# Patient Record
Sex: Female | Born: 1990
Health system: Southern US, Community
[De-identification: ages and names within clinical notes are randomized; demographics above are authoritative.]

## PROBLEM LIST (undated history)

## (undated) ENCOUNTER — Inpatient Hospital Stay (HOSPITAL_COMMUNITY): Payer: Self-pay

## (undated) DIAGNOSIS — B9689 Other specified bacterial agents as the cause of diseases classified elsewhere: Secondary | ICD-10-CM

## (undated) DIAGNOSIS — Z8619 Personal history of other infectious and parasitic diseases: Secondary | ICD-10-CM

## (undated) DIAGNOSIS — D649 Anemia, unspecified: Secondary | ICD-10-CM

## (undated) DIAGNOSIS — N760 Acute vaginitis: Secondary | ICD-10-CM

## (undated) HISTORY — DX: Anemia, unspecified: D64.9

## (undated) HISTORY — DX: Other specified bacterial agents as the cause of diseases classified elsewhere: N76.0

## (undated) HISTORY — DX: Other specified bacterial agents as the cause of diseases classified elsewhere: B96.89

## (undated) HISTORY — DX: Personal history of other infectious and parasitic diseases: Z86.19

---
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
## 2011-04-14 ENCOUNTER — Inpatient Hospital Stay (INDEPENDENT_AMBULATORY_CARE_PROVIDER_SITE_OTHER)
Admission: RE | Admit: 2011-04-14 | Discharge: 2011-04-14 | Disposition: A | Discharge status: Home / Self Care | Payer: Medicaid Other | Source: Ambulatory Visit | Attending: Family Medicine | Admitting: Family Medicine

## 2011-04-14 DIAGNOSIS — N390 Urinary tract infection, site not specified: Secondary | ICD-10-CM

## 2011-04-14 LAB — POCT URINALYSIS DIP (DEVICE)
Bilirubin Urine: NEGATIVE
Glucose, UA: NEGATIVE mg/dL
Nitrite: POSITIVE — AB
Urobilinogen, UA: 0.2 mg/dL (ref 0.0–1.0)

## 2011-04-14 LAB — POCT PREGNANCY, URINE: Preg Test, Ur: NEGATIVE

## 2011-05-14 IMAGING — CR DG RIBS W/ CHEST 3+V*L*
3 series · 3 of 3 positions shown · non-contrast
Comparison: 02/17/2006.

CLINICAL DATA: Fall.  Anterior rib pain.  Vomiting blood.

LEFT RIBS AND CHEST - 3+ VIEW

[w chest pa]
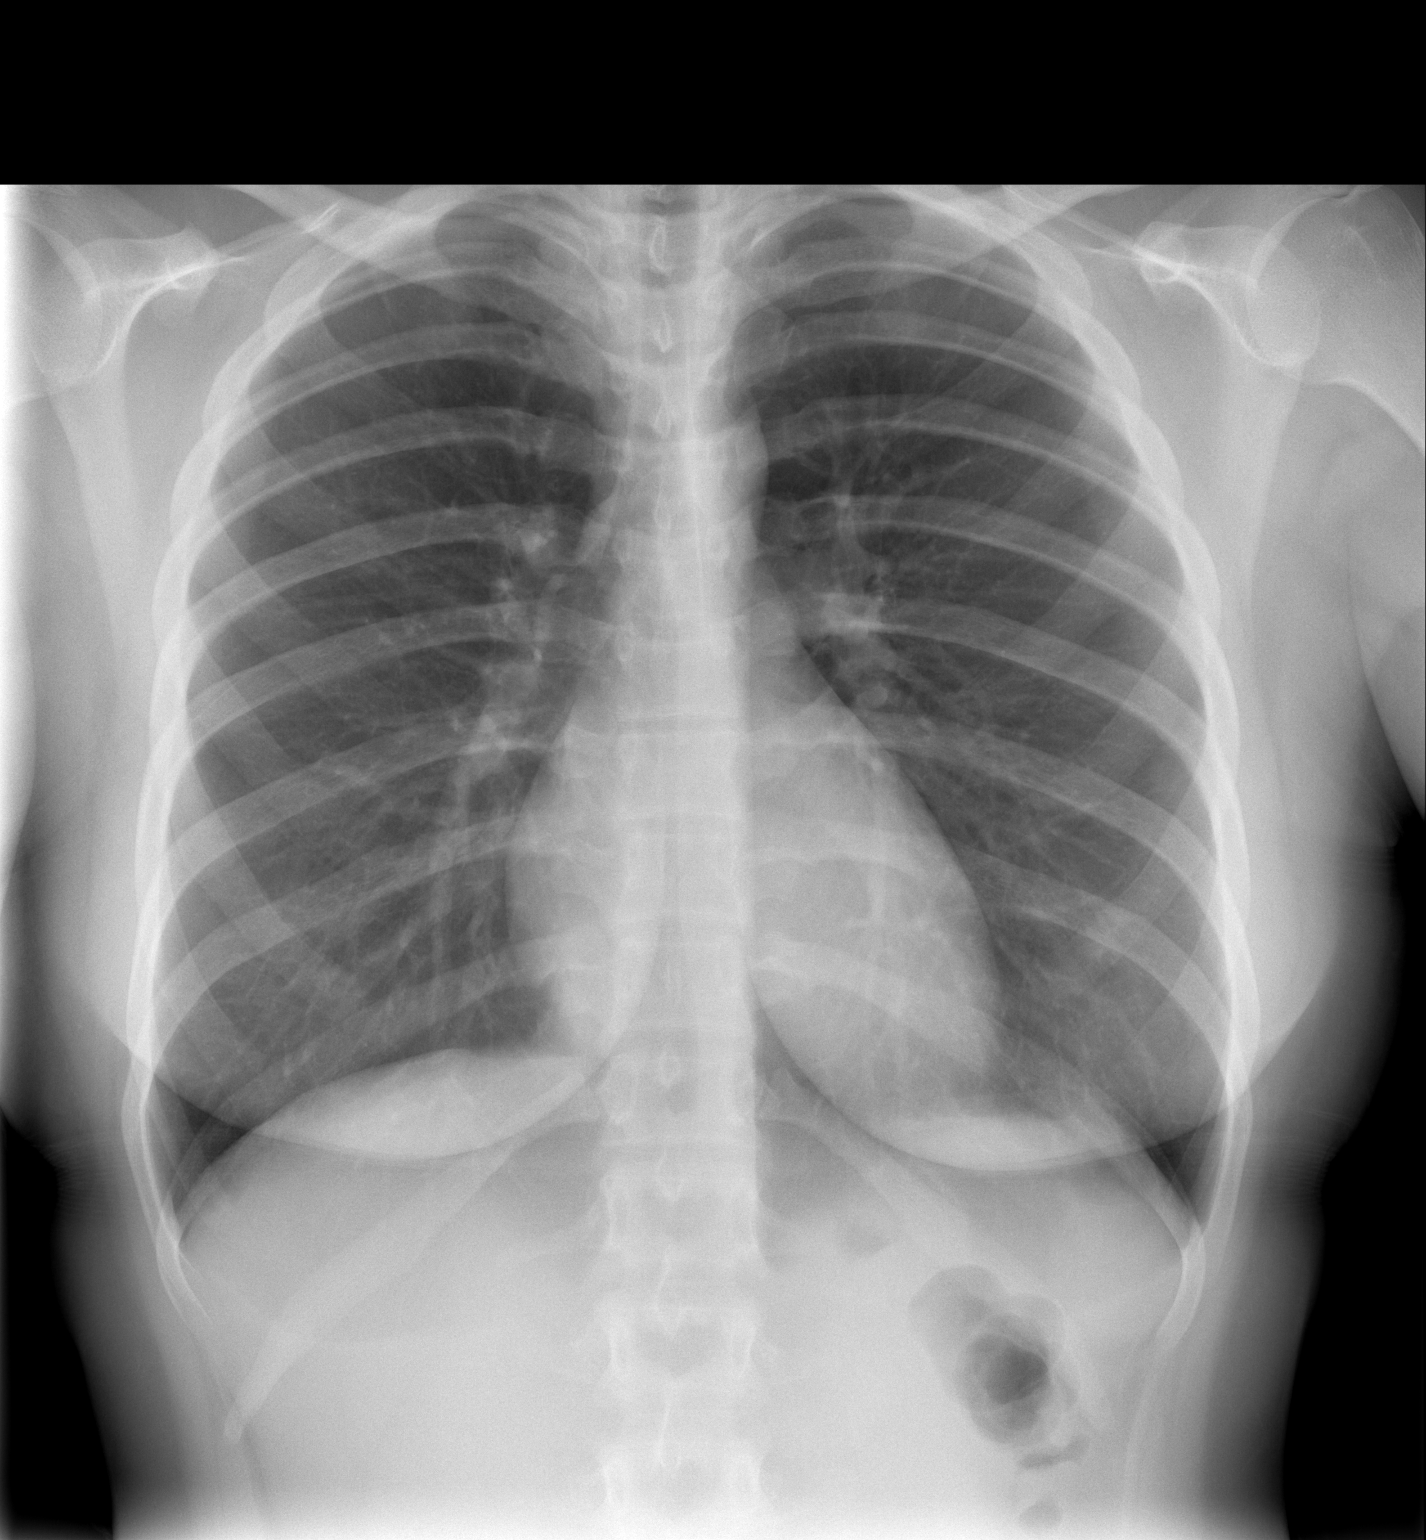

[w ribs ap/pa upper left]
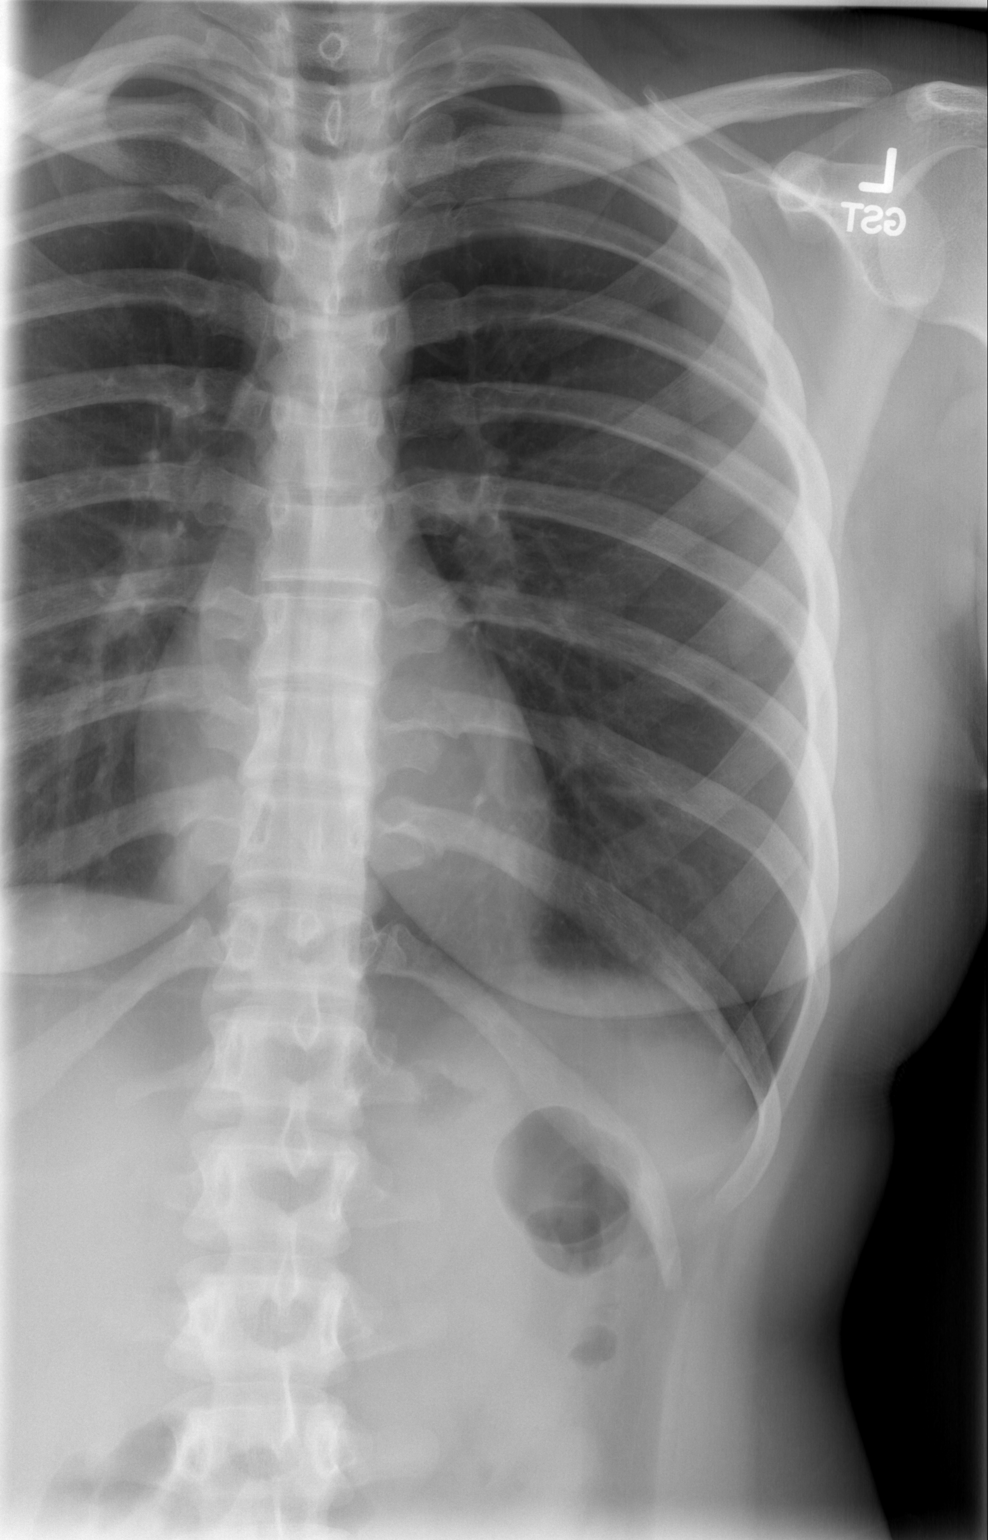

[w ribs oblique left]
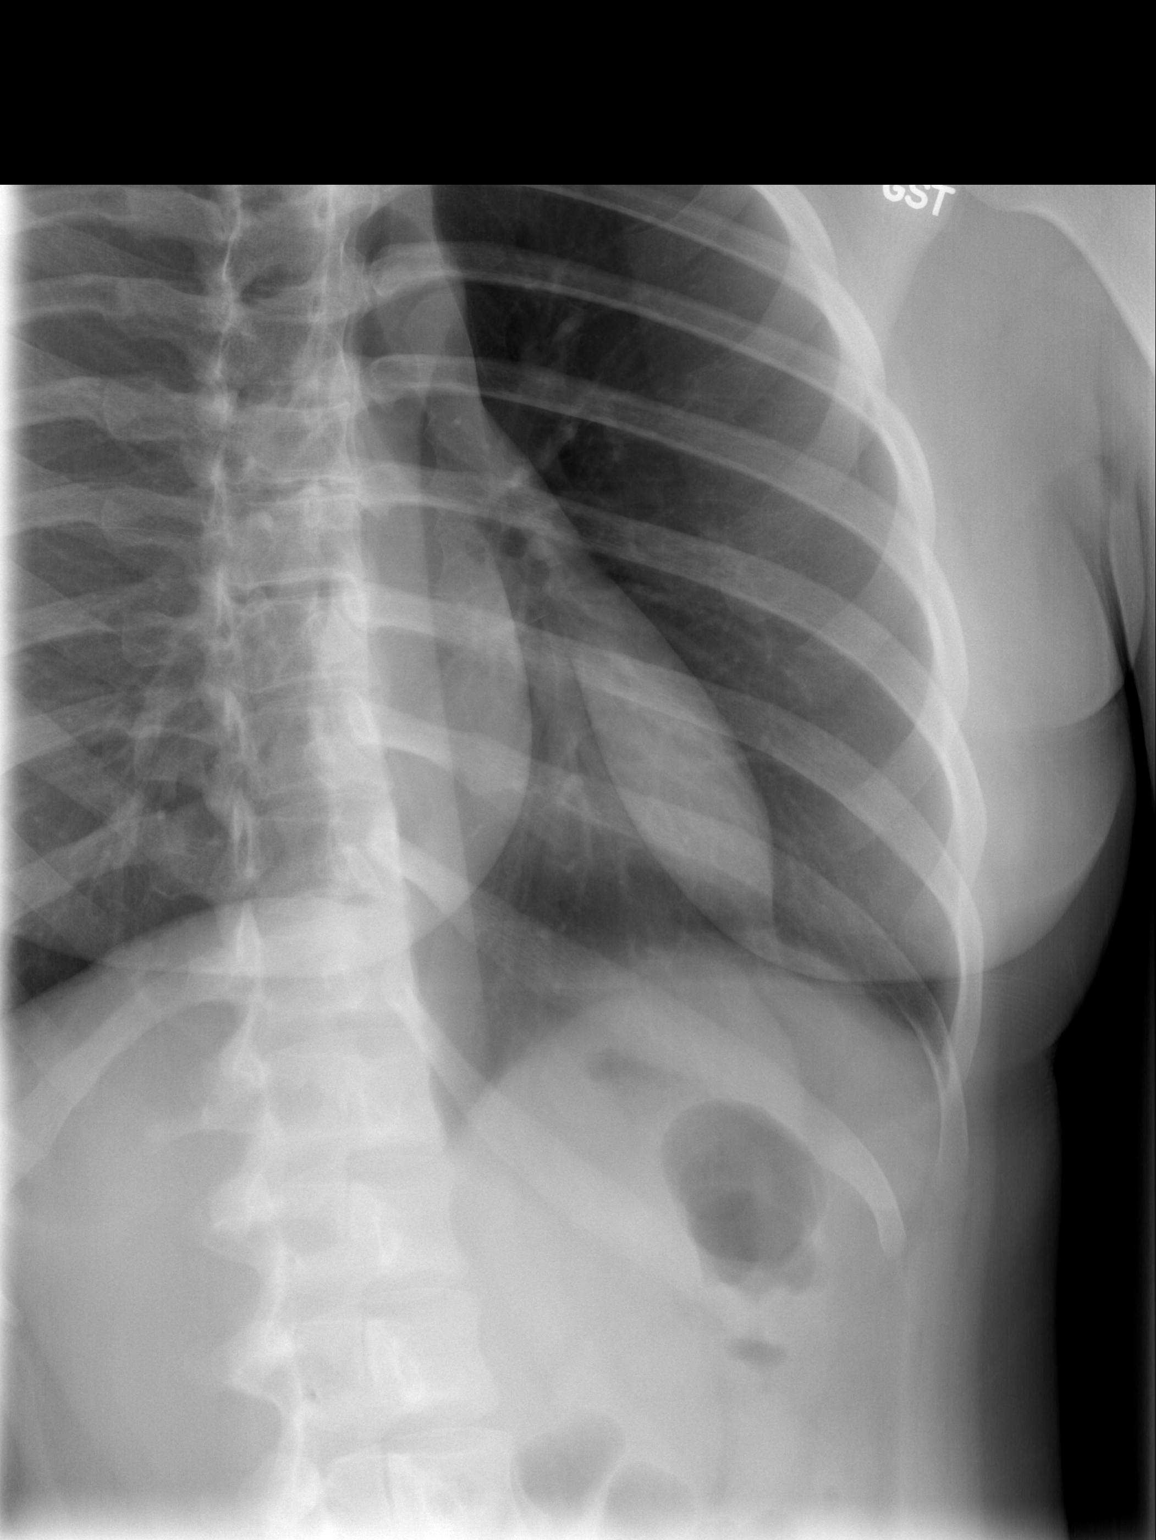

[3 of 3 positions shown; findings below may reference images not displayed]

FINDINGS: No displaced rib fractures or pneumothorax.
Cardiopericardial silhouette appears within normal limits.
Tracheal air column unremarkable.  Mediastinal contours normal.  No
pleural fluid.
IMPRESSION: No active cardiopulmonary disease or displaced rib fracture.

## 2011-09-16 ENCOUNTER — Inpatient Hospital Stay (HOSPITAL_COMMUNITY)
Admission: AD | Admit: 2011-09-16 | Discharge: 2011-09-17 | Disposition: A | Discharge status: Home / Self Care | Payer: Medicaid Other | Source: Ambulatory Visit | Attending: Obstetrics and Gynecology | Admitting: Obstetrics and Gynecology

## 2011-09-16 ENCOUNTER — Encounter (HOSPITAL_COMMUNITY): Payer: Self-pay | Admitting: *Deleted

## 2011-09-16 DIAGNOSIS — O99891 Other specified diseases and conditions complicating pregnancy: Secondary | ICD-10-CM | POA: Insufficient documentation

## 2011-09-16 DIAGNOSIS — R109 Unspecified abdominal pain: Secondary | ICD-10-CM | POA: Insufficient documentation

## 2011-09-16 LAB — URINALYSIS, ROUTINE W REFLEX MICROSCOPIC
Glucose, UA: NEGATIVE mg/dL
Ketones, ur: NEGATIVE mg/dL
Protein, ur: NEGATIVE mg/dL
Urobilinogen, UA: 0.2 mg/dL (ref 0.0–1.0)

## 2011-09-16 LAB — URINE MICROSCOPIC-ADD ON

## 2011-09-16 MED ORDER — PRENATAL VITAMINS 0.8 MG PO TABS: 1.0000 | ORAL_TABLET | Freq: Every day | ORAL | Status: DC

## 2011-09-16 NOTE — Progress Notes (Signed)
Written and verbal d/c instructions given and understanding voiced. 

## 2011-09-16 NOTE — Progress Notes (Signed)
Stacy Cruz CNM in to see pt 

## 2011-09-16 NOTE — Progress Notes (Signed)
Pt states,. " I started having pain in my right low abdomen yesterday and this evening it started again and it goes into my back."

## 2011-09-16 NOTE — ED Provider Notes (Signed)
Chief Complaint:  Abdominal Pain   Stacy Cruz is  20 y.o. G1P0 at [redacted]w[redacted]d presents complaining of Abdominal Pain .  She states none contractions associated with none vaginal bleeding, intact membranes, along with active fetal movement. She had a pain in upper abdomen that lasted around 10 minutes and was relieved by walking.  She ate a lot of beans earlier today  Obstetrical/Gynecological History: Menstrual History: OB History    Grav Para Term Preterm Abortions TAB SAB Ect Mult Living   1              Patient's last menstrual period was 04/05/2011.     Past Medical History: History reviewed. No pertinent past medical history.  Past Surgical History: Past Surgical History  Procedure Date  . Dental surgery     Family History: Family History  Problem Relation Age of Onset  . Anesthesia problems Neg Hx   . Hypotension Neg Hx   . Malignant hyperthermia Neg Hx   . Pseudochol deficiency Neg Hx     Social History: History  Substance Use Topics  . Smoking status: Never Smoker   . Smokeless tobacco: Not on file  . Alcohol Use: No    Allergies: Allergies not on file  Meds:  No prescriptions prior to admission    Review of Systems - Please refer to the aforementioned patients' reports.     Physical Exam  Height 5\' 4"  (1.626 m), weight 67.189 kg (148 lb 2 oz), last menstrual period 04/05/2011. GENERAL: Well-developed, well-nourished female in no acute distress.  LUNGS: Clear to auscultation bilaterally.  HEART: Regular rate and rhythm. ABDOMEN: Soft, nontender, nondistended, gravid.  EXTREMITIES: Nontender, no edema, 2+ distal pulses. Cervical Exam: Dilatation 0cm   Effacement 0%   Station not in pelvis   Presentation: unknown FHT:  Baseline rate 140 bpm   Variability moderate  Accelerations Reassuring   Decelerations none Contractions: Every 0 mins   Labs: No results found for this or any previous visit (from the past 24 hour(s)). Imaging Studies:  No  results found.  Assessment: Stacy Cruz is  20 y.o. G1P0 at [redacted]w[redacted]d presents with most likely a gas pain.  Plan: Don't eat so many beans.  Walk and massage stomach to help Pt already has an appt to start [facility] CRESENZO-DISHMAN,Stacy Cruz 12/15/201211:34 PM

## 2011-09-16 NOTE — Progress Notes (Signed)
Baby active 

## 2011-09-17 NOTE — ED Provider Notes (Signed)
Agree with above note.  Stacy Cruz 09/17/2011 7:38 AM

## 2011-10-03 NOTE — L&D Delivery Note (Signed)
Delivery Note At 12:07 PM a viable female was delivered via Vaginal, Spontaneous Delivery (Presentation: Left Occiput Anterior easy delivery of the head, loose nuchal cord x 1 slipped, easy delivery of the shoulders, baby placed on pt abd cord doubly clamped and cut by FOB. APGAR: 8, 8; weight 7 lb 2 oz (3232 g). SROM x 27 hours meconium. Placenta status:Spontaneous Intact, Spontaneous.  Cord: 3 vessels with the following complications: None.  Anesthesia: Epidural  Episiotomy: None Lacerations: Vaginal Suture Repair: 3-0 monocryl Est. Blood Loss (mL): 200  Mom to postpartum.  Baby to rooming in.  Melecio Cueto 01/22/2012, 12:57 PM   Delivery Summary for Stacy Cruz  Labor Events:   Preterm labor:   Rupture date:   Rupture time:   Rupture type: Spontaneous  Fluid Color:   Induction:   Augmentation:   Complications:   Cervical ripening:          Delivery:   Episiotomy:   Lacerations:   Repair suture:   Repair # of packets:   Blood loss (ml):    Information for the patient's newborn:  Sparkle, Aube [[id number]]    Delivery 01/22/2012 12:07 PM by  Vaginal, Spontaneous Delivery Sex:  female Gestational Age: <None> Delivery Clinician:  Lavera Guise Living?: Yes        APGARS  One minute Five minutes Ten minutes  Skin color: 0   0      Heart rate: 2   2      Grimace: 2   2      Muscle tone: 2   2      Breathing: 2   2      Totals: 8  8      Presentation/position: Vertex  Left Occiput Anterior Resuscitation: None  Cord information: 3 vessels   Disposition of cord blood: No    Blood gases sent? No Complications: None  Placenta: Delivered: 01/22/2012 12:20 PM  Spontaneous  Intact appearance Newborn Measurements: Weight: 7 lb 2 oz (3232 g)  Height: 20.25"  Head circumference: 31.1 cm  Chest circumference: 31.1 cm  Other providers: Delivery Nurse Delivery Assist Bethena Roys  Additional  information: Forceps:   Vacuum:    Breech:   Observed anomalies caput

## 2011-10-10 LAB — HIV ANTIBODY (ROUTINE TESTING W REFLEX): HIV: NONREACTIVE

## 2011-10-10 LAB — ABO/RH: RH Type: POSITIVE

## 2011-10-10 LAB — HEPATITIS B SURFACE ANTIGEN: Hepatitis B Surface Ag: NEGATIVE

## 2011-10-10 LAB — ANTIBODY SCREEN: Antibody Screen: NEGATIVE

## 2011-11-28 ENCOUNTER — Encounter (HOSPITAL_COMMUNITY): Payer: Self-pay | Admitting: *Deleted

## 2011-11-28 ENCOUNTER — Inpatient Hospital Stay (HOSPITAL_COMMUNITY)
Admission: AD | Admit: 2011-11-28 | Discharge: 2011-11-28 | Disposition: A | Discharge status: Home / Self Care | Payer: Medicaid Other | Source: Ambulatory Visit | Attending: Obstetrics and Gynecology | Admitting: Obstetrics and Gynecology

## 2011-11-28 DIAGNOSIS — N949 Unspecified condition associated with female genital organs and menstrual cycle: Secondary | ICD-10-CM | POA: Insufficient documentation

## 2011-11-28 DIAGNOSIS — Z331 Pregnant state, incidental: Secondary | ICD-10-CM

## 2011-11-28 DIAGNOSIS — O99891 Other specified diseases and conditions complicating pregnancy: Secondary | ICD-10-CM | POA: Insufficient documentation

## 2011-11-28 LAB — URINALYSIS, ROUTINE W REFLEX MICROSCOPIC
Bilirubin Urine: NEGATIVE
Glucose, UA: NEGATIVE mg/dL
Leukocytes, UA: NEGATIVE
Nitrite: NEGATIVE
Specific Gravity, Urine: <1.005 — ABNORMAL LOW (ref 1.005–1.030)
pH: 7 (ref 5.0–8.0)

## 2011-11-28 LAB — WET PREP, GENITAL: Yeast Wet Prep HPF POC: NONE SEEN

## 2011-11-28 LAB — URINE MICROSCOPIC-ADD ON

## 2011-11-28 MED ORDER — CYCLOBENZAPRINE HCL 10 MG PO TABS
10.0000 mg | ORAL_TABLET | Freq: Once | ORAL | Status: AC
  Administered 2011-11-28: 10 mg via ORAL
  Filled 2011-11-28: qty 1

## 2011-11-28 MED ORDER — ACETAMINOPHEN 500 MG PO TABS
1000.0000 mg | ORAL_TABLET | Freq: Once | ORAL | Status: AC
  Administered 2011-11-28: 1000 mg via ORAL
  Filled 2011-11-28: qty 2

## 2011-11-28 NOTE — Progress Notes (Signed)
Pt states she has been feeling pelvic pressure all day

## 2011-11-28 NOTE — ED Provider Notes (Signed)
History     Chief Complaint  Patient presents with  . Pelvic Pain   HPI Comments: Pt is a G1P0 at [redacted]w[redacted]d with cc of pelvic pain that is constant, denies ctx, VB or LOF, GFM. States pain has been going on all day.  Pregnancy significant for:  LTC Hx BV   Pelvic Pain The patient's primary symptoms include pelvic pain.      Past Medical History  Diagnosis Date  . No pertinent past medical history     Past Surgical History  Procedure Date  . Dental surgery     Family History  Problem Relation Age of Onset  . Anesthesia problems Neg Hx   . Hypotension Neg Hx   . Malignant hyperthermia Neg Hx   . Pseudochol deficiency Neg Hx     History  Substance Use Topics  . Smoking status: Never Smoker   . Smokeless tobacco: Not on file  . Alcohol Use: No    Allergies: No Known Allergies  Prescriptions prior to admission  Medication Sig Dispense Refill  . Prenatal Vit-Fe Fumarate-FA (PRENATAL MULTIVITAMIN) TABS Take 1 tablet by mouth daily.        Review of Systems  Genitourinary: Positive for pelvic pain.  All other systems reviewed and are negative.   Physical Exam   Blood pressure 117/84, pulse 102, temperature 98.2 F (36.8 C), temperature source Oral, resp. rate 20, height 5\' 4"  (1.626 m), weight 78.926 kg (174 lb), last menstrual period 04/05/2011, SpO2 100.00%.  Physical Exam  Nursing note and vitals reviewed. Constitutional: She is oriented to person, place, and time. She appears well-developed and well-nourished.  HENT:  Head: Normocephalic.  Neck: Normal range of motion.  Cardiovascular: Normal rate.   Respiratory: Effort normal.  GI: Soft. She exhibits no distension. There is no tenderness.       Gravid, soft  Genitourinary: Vaginal discharge found.       Mod amt milky white d/c  VE=cl/th/high  Musculoskeletal: Normal range of motion.  Neurological: She is alert and oriented to person, place, and time.  Skin: Skin is warm and dry.  Psychiatric: She  has a normal mood and affect. Her behavior is normal.   FHR 120 reactive CAT 1 toco quiet some UI  MAU Course  Procedures    Assessment and Plan  IUP at [redacted]w[redacted]d Wet prep, GC/CT, FFN, UA pending Will give tylenol PO Await results  Gizell Danser M 11/28/2011, 9:46 PM   2/26 at 2117 Tylenol didn't help Flexeril given PO,  Now sleepy Ready for D/C Labs WNL rv'd FKC and PTL sx's and stretches for RLP  Pt has f/u next week

## 2011-11-28 NOTE — Progress Notes (Signed)
SSE per CNM.  Wet prep and cultures collected.  VE done.  

## 2011-11-28 NOTE — Progress Notes (Signed)
[name], CNM at bedside.  Assessment done and poc discussed with pt.  

## 2011-11-28 NOTE — Discharge Instructions (Signed)

## 2011-11-29 LAB — GC/CHLAMYDIA PROBE AMP, GENITAL
Chlamydia, DNA Probe: NEGATIVE
GC Probe Amp, Genital: NEGATIVE

## 2011-12-11 ENCOUNTER — Encounter (INDEPENDENT_AMBULATORY_CARE_PROVIDER_SITE_OTHER): Payer: Medicaid Other | Admitting: Obstetrics and Gynecology

## 2011-12-11 DIAGNOSIS — Z331 Pregnant state, incidental: Secondary | ICD-10-CM

## 2011-12-19 ENCOUNTER — Encounter (INDEPENDENT_AMBULATORY_CARE_PROVIDER_SITE_OTHER): Payer: Medicaid Other | Admitting: Obstetrics and Gynecology

## 2011-12-19 DIAGNOSIS — Z3480 Encounter for supervision of other normal pregnancy, unspecified trimester: Secondary | ICD-10-CM

## 2011-12-19 DIAGNOSIS — Z202 Contact with and (suspected) exposure to infections with a predominantly sexual mode of transmission: Secondary | ICD-10-CM

## 2011-12-26 ENCOUNTER — Encounter: Payer: Medicaid Other | Admitting: Obstetrics and Gynecology

## 2011-12-27 ENCOUNTER — Encounter (INDEPENDENT_AMBULATORY_CARE_PROVIDER_SITE_OTHER): Payer: Medicaid Other | Admitting: Obstetrics and Gynecology

## 2011-12-27 DIAGNOSIS — Z3480 Encounter for supervision of other normal pregnancy, unspecified trimester: Secondary | ICD-10-CM

## 2012-01-02 ENCOUNTER — Telehealth: Payer: Self-pay

## 2012-01-02 NOTE — Telephone Encounter (Signed)
Pt calling to report abdominal "tightenings" over the last hour, and states "unsure if contractions."  Primagravida at term.  Reports EDC 01/15/12.  No LOF or VB.  Cx at her exam last week was "closed, but thinning."  GFM.  Pt hasn't tried to time contractions.  Rec'd pt time them over the next hr and if less than 5 min apart to call back and will eval in MAU, or can come in prn.  Reports "tightenings don't last that long."  F/u prn.

## 2012-01-03 ENCOUNTER — Encounter (HOSPITAL_COMMUNITY): Payer: Self-pay | Admitting: *Deleted

## 2012-01-03 ENCOUNTER — Inpatient Hospital Stay (HOSPITAL_COMMUNITY)
Admission: AD | Admit: 2012-01-03 | Discharge: 2012-01-03 | Disposition: A | Discharge status: Home / Self Care | Payer: Medicaid Other | Attending: Obstetrics and Gynecology | Admitting: Obstetrics and Gynecology

## 2012-01-03 DIAGNOSIS — O479 False labor, unspecified: Secondary | ICD-10-CM | POA: Insufficient documentation

## 2012-01-03 DIAGNOSIS — O471 False labor at or after 37 completed weeks of gestation: Secondary | ICD-10-CM

## 2012-01-03 MED ORDER — ZOLPIDEM TARTRATE 10 MG PO TABS: 10.0000 mg | ORAL_TABLET | Freq: Every evening | ORAL | Status: DC | PRN

## 2012-01-03 MED ORDER — OXYCODONE-ACETAMINOPHEN 5-325 MG PO TABS
2.0000 | ORAL_TABLET | Freq: Once | ORAL | Status: DC
  Filled 2012-01-03: qty 2

## 2012-01-03 NOTE — Discharge Instructions (Signed)

## 2012-01-03 NOTE — MAU Provider Note (Signed)
History   Stacy Cruz is a 21 y.o. SBF at 38.2 weeks who presents for labor check.  Pt called around 2330 reporting probable contractions, but had not timed them.  Rec'd pt time them and call back when less than 5 minutes apart, and rec'd if less than than after timed them for the next hour, to take a tablet of Benadryl.  Pt's mother called back around 0315 to report ctxs every 6 minutes and that she hadn't been able to sleep, and I rec'd them come in for labor check.  Pt denies any VB or LOF.  Reports GFM.  No UTI or PIH s/s.  No GI or resp c/o's.   Cx at her last appt was "closed."   Pregnancy r/f: 1.  Late to care 2.  Anemia 3.  BV in pregnancy  CSN: [id number]  Arrival date and time: 01/03/12 0344   None     Chief Complaint  Patient presents with  . Contractions   HPI  OB History    Grav Para Term Preterm Abortions TAB SAB Ect Mult Living   1               Past Medical History  Diagnosis Date  . No pertinent past medical history     Past Surgical History  Procedure Date  . Dental surgery     Family History  Problem Relation Age of Onset  . Anesthesia problems Neg Hx   . Hypotension Neg Hx   . Malignant hyperthermia Neg Hx   . Pseudochol deficiency Neg Hx   . Cancer Mother   . Diabetes Mother   . Hypertension Mother   . Cancer Father   . Diabetes Father   . Hypertension Father     History  Substance Use Topics  . Smoking status: Never Smoker   . Smokeless tobacco: Never Used  . Alcohol Use: No    Allergies: No Known Allergies  Prescriptions prior to admission  Medication Sig Dispense Refill  . diphenhydrAMINE (BENADRYL) 25 MG tablet Take 25 mg by mouth every 6 (six) hours as needed. As needed for sleep      . Prenatal Vit-Fe Fumarate-FA (PRENATAL MULTIVITAMIN) TABS Take 1 tablet by mouth daily.        ROS--see history above Physical Exam   Blood pressure 127/71, pulse 92, temperature 98.2 F (36.8 C), temperature source Oral, resp. rate 20,  height 5\' 4"  (1.626 m), weight 81.647 kg (180 lb), last menstrual period 04/05/2011.  Physical Exam  Constitutional: She is oriented to person, place, and time. She appears well-developed and well-nourished. No distress.       Pt lying on her Lt side and no grimace and no labored breathing.  HENT:  Head: Atraumatic.       glasses  Cardiovascular: Normal rate.   Respiratory: Effort normal.  GI: Soft.       gravid  Genitourinary:       Cx:  Closed/long/-2  Musculoskeletal: She exhibits no edema.  Neurological: She is alert and oriented to person, place, and time.  Skin: Skin is warm and dry.  EFM:  120, reactive, no decels, moderate variability TOCO:  ctxs q 10-15 minutes w/ UI in between  MAU Course  Procedures 1.  NST 2.  Percocet 2 tabs po in MAU before d/c home  Assessment and Plan  1.  IUP at 38.2 weeks 2.  No cervical change since last check 3.  occ'l ctx w/ UI 4.  Reactive  NST  1.  D/c'd home w/ labor precautions and FKC 2.  Pt given Rx for Ambien 10mg  po qhs prn insomnia (#30 w/ no RF) 3.  F/u this Friday 01/05/12 at office as scheduled or prn  Alicea Wente H 01/03/2012, 4:50 AM

## 2012-01-03 NOTE — MAU Note (Signed)
Pt presents for a labor check-states,"I think I'm having contractions."

## 2012-01-05 ENCOUNTER — Encounter (INDEPENDENT_AMBULATORY_CARE_PROVIDER_SITE_OTHER): Payer: Medicaid Other | Admitting: Registered Nurse

## 2012-01-05 ENCOUNTER — Encounter: Payer: Medicaid Other | Admitting: Obstetrics and Gynecology

## 2012-01-05 DIAGNOSIS — Z331 Pregnant state, incidental: Secondary | ICD-10-CM

## 2012-01-11 ENCOUNTER — Other Ambulatory Visit: Payer: Self-pay | Admitting: Obstetrics and Gynecology

## 2012-01-11 ENCOUNTER — Inpatient Hospital Stay (HOSPITAL_COMMUNITY)
Admission: AD | Admit: 2012-01-11 | Discharge: 2012-01-11 | Disposition: A | Discharge status: Home / Self Care | Payer: Medicaid Other | Source: Ambulatory Visit | Attending: Obstetrics and Gynecology | Admitting: Obstetrics and Gynecology

## 2012-01-11 ENCOUNTER — Encounter (HOSPITAL_COMMUNITY): Payer: Self-pay | Admitting: *Deleted

## 2012-01-11 ENCOUNTER — Telehealth: Payer: Self-pay | Admitting: Obstetrics and Gynecology

## 2012-01-11 DIAGNOSIS — O2390 Unspecified genitourinary tract infection in pregnancy, unspecified trimester: Secondary | ICD-10-CM | POA: Insufficient documentation

## 2012-01-11 DIAGNOSIS — B3731 Acute candidiasis of vulva and vagina: Secondary | ICD-10-CM

## 2012-01-11 DIAGNOSIS — B373 Candidiasis of vulva and vagina: Secondary | ICD-10-CM

## 2012-01-11 DIAGNOSIS — Z331 Pregnant state, incidental: Secondary | ICD-10-CM

## 2012-01-11 DIAGNOSIS — O99891 Other specified diseases and conditions complicating pregnancy: Secondary | ICD-10-CM | POA: Insufficient documentation

## 2012-01-11 LAB — AMNISURE RUPTURE OF MEMBRANE (ROM) NOT AT ARMC: Amnisure ROM: NEGATIVE

## 2012-01-11 MED ORDER — FLUCONAZOLE 200 MG PO TABS: 200.0000 mg | ORAL_TABLET | Freq: Once | ORAL | Status: AC | PRN

## 2012-01-11 NOTE — Discharge Instructions (Signed)
Labor Induction  Most women go into labor on their own between 37 and 42 weeks of the pregnancy. When this does not happen or when there is a medical need, medicine or other methods may be used to induce labor. Labor induction causes a pregnant woman's uterus to contract. It also causes the cervix to soften (ripen), open (dilate), and thin out (efface). Usually, labor is not induced before 39 weeks of the pregnancy unless there is a problem with the baby or mother. Whether your labor will be induced depends on a number of factors, including the following:  The medical condition of you and the baby.   How many weeks along you are.   The status of baby's lung maturity.   The condition of the cervix.   The position of the baby.  REASONS FOR LABOR INDUCTION  The health of the baby or mother is at risk.   The pregnancy is overdue by 1 week or more.   The water breaks but labor does not start on its own.   The mother has a health condition or serious illness such as high blood pressure, infection, placental abruption, or diabetes.   The amniotic fluid amounts are low around the baby.   The baby is distressed.  REASONS TO NOT INDUCE LABOR Labor induction may not be a good idea if:  It is shown that your baby does not tolerate labor.   An induction is just more convenient.   You want the baby to be born on a certain date, like a holiday.   You have had previous surgeries on your uterus, such as a myomectomy or the removal of fibroids.   Your placenta lies very low in the uterus and blocks the opening of the cervix (placenta previa).   Your baby is not in a head down position.   The umbilical cord drops down into the birth canal in front of the baby. This could cut off the baby's blood and oxygen supply.   You have had a previous cesarean delivery.   There areunusual circumstances, such as the baby being extremely premature.  RISKS AND COMPLICATIONS Problems may occur in the  process of induction and plans may need to be modified as a situation unfolds. Some of the risks of induction include:  Change in fetal heart rate, such as too high, too low, or erratic.   Risk of fetal distress.   Risk of infection to mother and baby.   Increased chance of having a cesarean delivery.   The rare, but increased chance that the placenta will separate from the uterus (abruption).   Uterine rupture (very rare).  When induction is needed for medical reasons, the benefits of induction may outweigh the risks. BEFORE THE PROCEDURE Your caregiver will check your cervix and the baby's position. This will help your caregiver decide if you are far enough along for an induction to work. PROCEDURE Several methods of labor induction may be used, such as:   Taking prostaglandin medicine to dilate and ripen the cervix. The medicine will also start contractions. It can be taken by mouth or by inserting a suppository into the vagina.   A thin tube (catheter) with a balloon on the end may be inserted into your vagina to dilate the cervix. Once inserted, the balloon expands with water, which causes the cervix to open.   Striping the membranes. Your caregiver inserts a finger between the cervix and membranes, which causes the cervix to be stretched and   may cause the uterus to contract. This is often done during an office visit. You will be sent home to wait for the contractions to begin. You will then come in for an induction.   Breaking the water. Your caregiver will make a hole in the amniotic sac using a small instrument. Once the amniotic sac breaks, contractions should begin. This may still take hours to see an effect.   Taking medicine to trigger or strengthen contractions. This medicine is given intravenously through a tube in your arm.  All of the methods of induction, besides stripping the membranes, will be done in the hospital. Induction is done in the hospital so that you and the  baby can be carefully monitored. AFTER THE PROCEDURE Some inductions can take up to 2 or 3 days. Depending on the cervix, it usually takes less time. It takes longer when you are induced early in the pregnancy or if this is your first pregnancy. If a mother is still pregnant and the induction has been going on for 2 to 3 days, either the mother will be sent home or a cesarean delivery will be needed. Document Released: 02/07/2007 Document Revised: 09/07/2011 Document Reviewed: 07/24/2011 [facility] Patient Information 2012 [place], [place].Vaginitis Vaginitis in a soreness, swelling and redness (inflammation) of the vagina and vulva. This is not a sexually transmitted infection.  CAUSES  Yeast vaginitis is caused by yeast (candida) that is normally found in your vagina. With a yeast infection, the candida has over grown in number to a point that upsets the chemical balance. SYMPTOMS   White thick vaginal discharge.   Swelling, itching, redness and irritation of the vagina and possibly the lips of the vagina (vulva).   Burning or painful urination.   Painful intercourse.  HOME CARE INSTRUCTIONS   Finish all medication as prescribed.   Do not have sex until treatment is completed or instructed by your healthcare giver.   Take warm sitz baths.   Do not douche.   Do not use tampons, especially scented ones.   Wear cotton underwear.   Avoid tight pants and panty hose.   Tell your sexual partner that you have a yeast infection. They should go to their caregiver if they have symptoms such as mild rash or itching.   Your sexual partner should be treated if your infection is difficult to eliminate.   Practice safer sex. Use condoms.   Some vaginal medications cause latex condoms to fail. Ask your caregiver this.  SEEK MEDICAL CARE IF:   You develop a fever.   The infection is getting worse after 2 days of treatment.   The infection is not getting better after 3 days of treatment.     You develop blisters in or around your vagina.   You develop vaginal bleeding, and it is not your menstrual period.   You have pain when you urinate.   You develop intestinal problems.   You have pain with sexual intercourse.  Document Released: 10/26/2004 Document Revised: 09/07/2011 Document Reviewed: 06/03/2009 [facility] Patient Information 2012 [place], [place].

## 2012-01-11 NOTE — MAU Note (Signed)
History    21 yo G1P0 at 2 3/7 weeks presented c/o questionable leaking since 12:30pm today.  Awoke from a nap, felt wet, and has felt wet at intervals since.  Reports occasional back pain, unsure if UCs.  Positive FM.  Cervix has been closed at the office, last visit 1 week ago.  Pregnancy remarkable for: Late to care at 30 weeks Anemia BV in pregnancy    OB History    Grav Para Term Preterm Abortions TAB SAB Ect Mult Living   1 0 0 0 0 0 0 0 0 0       Past Medical History  Diagnosis Date  . No pertinent past medical history     Past Surgical History  Procedure Date  . Dental surgery     Family History  Problem Relation Age of Onset  . Anesthesia problems Neg Hx   . Hypotension Neg Hx   . Malignant hyperthermia Neg Hx   . Pseudochol deficiency Neg Hx   . Cancer Mother   . Diabetes Mother   . Hypertension Mother   . Cancer Father   . Diabetes Father   . Hypertension Father     History  Substance Use Topics  . Smoking status: Never Smoker   . Smokeless tobacco: Never Used  . Alcohol Use: No    Allergies: No Known Allergies  Prescriptions prior to admission  Medication Sig Dispense Refill  . Prenatal Vit-Fe Fumarate-FA (PRENATAL MULTIVITAMIN) TABS Take 1 tablet by mouth every morning.          Physical Exam   Blood pressure 126/74, pulse 91, temperature 99.1 F (37.3 C), temperature source Oral, resp. rate 18, height 5\' 4"  (1.626 m), weight 182 lb (82.555 kg), last menstrual period 04/05/2011.  Chest clear Heart RRR without murmur Abd gravid, NT Pelvic--vagina full of d/c c/w yeast.  No obvious pooling. Fern negative.  Wet prep + yeast hyphae Cervix posterior, FT, 50%, vtx -2.  Amnisure done  FHR Category 1 UCs irregular, mild  ED Course  IUP at 39 3/7 weeks ? SROM  Definite yeast vaginitis  Plan: Await amnisure. If negative, anticipate d/c home with treatment for yeast.  Nigel Bridgeman, CNM, [place] 01/11/12 7:20pm

## 2012-01-11 NOTE — Progress Notes (Signed)
Fhts 120 accels LTV mod varaible x 1 uc irregular with irritability Neg amnisure A vag yeast     Not in labor P discharge home s/s labor,uc, srom, vag bleeding, kick counts to report reviewed, RX diflucan discussed repeat in 3 days. Lavera Guise, CNM

## 2012-01-11 NOTE — Telephone Encounter (Signed)
Spoke with pt.   States underwear was wet when went to the BR.  Continues to leak clear watery fluid. +FM. No cont.  Consult w/VL.   No appt. Available in office.   Pt directed to MAU   VM left for VL.

## 2012-01-12 ENCOUNTER — Encounter: Payer: Self-pay | Admitting: Obstetrics and Gynecology

## 2012-01-12 ENCOUNTER — Ambulatory Visit (INDEPENDENT_AMBULATORY_CARE_PROVIDER_SITE_OTHER): Payer: Medicaid Other | Admitting: Obstetrics and Gynecology

## 2012-01-12 VITALS — BP 110/68 | Wt 185.0 lb

## 2012-01-12 DIAGNOSIS — O0930 Supervision of pregnancy with insufficient antenatal care, unspecified trimester: Secondary | ICD-10-CM

## 2012-01-12 NOTE — Progress Notes (Signed)
   The patient  has no unusual complaints NST next week

## 2012-01-12 NOTE — Progress Notes (Signed)
Addended by: Silverio Lay on: 01/12/2012 01:28 PM   Modules accepted: Orders

## 2012-01-12 NOTE — Progress Notes (Signed)
CC. Pt stated no issues today. Pt want a cervix check today. bt cma

## 2012-01-15 ENCOUNTER — Encounter: Payer: Self-pay | Admitting: Obstetrics and Gynecology

## 2012-01-16 DIAGNOSIS — D649 Anemia, unspecified: Secondary | ICD-10-CM

## 2012-01-16 DIAGNOSIS — N760 Acute vaginitis: Secondary | ICD-10-CM

## 2012-01-16 DIAGNOSIS — B9689 Other specified bacterial agents as the cause of diseases classified elsewhere: Secondary | ICD-10-CM | POA: Insufficient documentation

## 2012-01-17 ENCOUNTER — Other Ambulatory Visit: Payer: Medicaid Other

## 2012-01-17 ENCOUNTER — Ambulatory Visit (INDEPENDENT_AMBULATORY_CARE_PROVIDER_SITE_OTHER): Payer: Medicaid Other | Admitting: Obstetrics and Gynecology

## 2012-01-17 VITALS — BP 120/80 | Wt 188.0 lb

## 2012-01-17 DIAGNOSIS — O480 Post-term pregnancy: Secondary | ICD-10-CM

## 2012-01-17 DIAGNOSIS — Z331 Pregnant state, incidental: Secondary | ICD-10-CM

## 2012-01-17 NOTE — Progress Notes (Signed)
NST today - 120 reactive cat 1 Pt feels well, denies HA GFM No ctx, no VB or LOF  2+ pedal edema, normal reflexes Membranes swept Rv'd normal labor sx's Inc PO fluids and protein intake FKC  NST only on Friday 4-19 BPP on Monday with ROB

## 2012-01-17 NOTE — Patient Instructions (Signed)

## 2012-01-19 ENCOUNTER — Ambulatory Visit (INDEPENDENT_AMBULATORY_CARE_PROVIDER_SITE_OTHER): Payer: Medicaid Other | Admitting: Obstetrics and Gynecology

## 2012-01-19 VITALS — BP 110/70

## 2012-01-19 DIAGNOSIS — O480 Post-term pregnancy: Secondary | ICD-10-CM

## 2012-01-19 NOTE — Progress Notes (Signed)
Pt last ate 3:30 pm

## 2012-01-21 ENCOUNTER — Encounter (HOSPITAL_COMMUNITY): Payer: Self-pay | Admitting: Obstetrics and Gynecology

## 2012-01-21 ENCOUNTER — Encounter (HOSPITAL_COMMUNITY): Payer: Self-pay | Admitting: Anesthesiology

## 2012-01-21 ENCOUNTER — Inpatient Hospital Stay (HOSPITAL_COMMUNITY): Payer: Medicaid Other | Admitting: Anesthesiology

## 2012-01-21 ENCOUNTER — Inpatient Hospital Stay (HOSPITAL_COMMUNITY)
Admission: AD | Admit: 2012-01-21 | Discharge: 2012-01-24 | DRG: 775 | Disposition: A | Discharge status: Home / Self Care | Payer: Medicaid Other | Source: Ambulatory Visit | Attending: Obstetrics and Gynecology | Admitting: Obstetrics and Gynecology

## 2012-01-21 DIAGNOSIS — O0930 Supervision of pregnancy with insufficient antenatal care, unspecified trimester: Secondary | ICD-10-CM

## 2012-01-21 DIAGNOSIS — IMO0002 Reserved for concepts with insufficient information to code with codable children: Secondary | ICD-10-CM

## 2012-01-21 LAB — CBC
MCH: 21.9 pg — ABNORMAL LOW (ref 26.0–34.0)
MCHC: 30.3 g/dL (ref 30.0–36.0)
Platelets: 191 10*3/uL (ref 150–400)
RDW: 17.4 % — ABNORMAL HIGH (ref 11.5–15.5)

## 2012-01-21 LAB — RPR: RPR Ser Ql: NONREACTIVE

## 2012-01-21 MED ORDER — LACTATED RINGERS IV SOLN
500.0000 mL | INTRAVENOUS | Status: DC | PRN
  Administered 2012-01-21: 1000 mL via INTRAVENOUS
  Administered 2012-01-22: 500 mL via INTRAVENOUS

## 2012-01-21 MED ORDER — PHENYLEPHRINE 40 MCG/ML (10ML) SYRINGE FOR IV PUSH (FOR BLOOD PRESSURE SUPPORT): 80.0000 ug | PREFILLED_SYRINGE | INTRAVENOUS | Status: DC | PRN

## 2012-01-21 MED ORDER — FENTANYL 2.5 MCG/ML BUPIVACAINE 1/10 % EPIDURAL INFUSION (WH - ANES)
14.0000 mL/h | INTRAMUSCULAR | Status: DC
  Administered 2012-01-21 – 2012-01-22 (×5): 14 mL/h via EPIDURAL
  Filled 2012-01-21 (×5): qty 60

## 2012-01-21 MED ORDER — PROMETHAZINE HCL 25 MG/ML IJ SOLN
12.5000 mg | INTRAMUSCULAR | Status: DC | PRN
  Administered 2012-01-21: 12.5 mg via INTRAVENOUS
  Filled 2012-01-21: qty 1

## 2012-01-21 MED ORDER — LACTATED RINGERS IV SOLN
500.0000 mL | Freq: Once | INTRAVENOUS | Status: AC
  Administered 2012-01-21: 500 mL via INTRAVENOUS

## 2012-01-21 MED ORDER — OXYTOCIN 20 UNITS IN LACTATED RINGERS INFUSION - SIMPLE
1.0000 m[IU]/min | INTRAVENOUS | Status: DC
  Administered 2012-01-21: 1 m[IU]/min via INTRAVENOUS
  Filled 2012-01-21: qty 1000

## 2012-01-21 MED ORDER — DIPHENHYDRAMINE HCL 50 MG/ML IJ SOLN: 12.5000 mg | INTRAMUSCULAR | Status: DC | PRN

## 2012-01-21 MED ORDER — LIDOCAINE HCL (PF) 1 % IJ SOLN
INTRAMUSCULAR | Status: DC | PRN
  Administered 2012-01-21 (×3): 4 mL

## 2012-01-21 MED ORDER — FLEET ENEMA 7-19 GM/118ML RE ENEM: 1.0000 | ENEMA | RECTAL | Status: DC | PRN

## 2012-01-21 MED ORDER — HYDROXYZINE HCL 50 MG/ML IM SOLN: 50.0000 mg | Freq: Four times a day (QID) | INTRAMUSCULAR | Status: DC | PRN

## 2012-01-21 MED ORDER — LIDOCAINE HCL (PF) 1 % IJ SOLN
30.0000 mL | INTRAMUSCULAR | Status: DC | PRN
  Filled 2012-01-21: qty 30

## 2012-01-21 MED ORDER — PHENYLEPHRINE 40 MCG/ML (10ML) SYRINGE FOR IV PUSH (FOR BLOOD PRESSURE SUPPORT)
80.0000 ug | PREFILLED_SYRINGE | INTRAVENOUS | Status: DC | PRN
  Filled 2012-01-21: qty 5

## 2012-01-21 MED ORDER — CITRIC ACID-SODIUM CITRATE 334-500 MG/5ML PO SOLN: 30.0000 mL | ORAL | Status: DC | PRN

## 2012-01-21 MED ORDER — ONDANSETRON HCL 4 MG/2ML IJ SOLN: 4.0000 mg | Freq: Four times a day (QID) | INTRAMUSCULAR | Status: DC | PRN

## 2012-01-21 MED ORDER — TERBUTALINE SULFATE 1 MG/ML IJ SOLN: 0.2500 mg | Freq: Once | INTRAMUSCULAR | Status: AC | PRN

## 2012-01-21 MED ORDER — ACETAMINOPHEN 325 MG PO TABS: 650.0000 mg | ORAL_TABLET | ORAL | Status: DC | PRN

## 2012-01-21 MED ORDER — OXYTOCIN BOLUS FROM INFUSION
500.0000 mL | Freq: Once | INTRAVENOUS | Status: AC
  Administered 2012-01-22: 500 mL via INTRAVENOUS
  Filled 2012-01-21: qty 500
  Filled 2012-01-21: qty 1000

## 2012-01-21 MED ORDER — NALBUPHINE SYRINGE 5 MG/0.5 ML
5.0000 mg | INJECTION | INTRAMUSCULAR | Status: DC | PRN
  Administered 2012-01-21 (×2): 10 mg via INTRAVENOUS
  Filled 2012-01-21 (×2): qty 1

## 2012-01-21 MED ORDER — OXYTOCIN 20 UNITS IN LACTATED RINGERS INFUSION - SIMPLE
125.0000 mL/h | Freq: Once | INTRAVENOUS | Status: AC
  Administered 2012-01-22: 125 mL/h via INTRAVENOUS

## 2012-01-21 MED ORDER — OXYCODONE-ACETAMINOPHEN 5-325 MG PO TABS: 1.0000 | ORAL_TABLET | ORAL | Status: DC | PRN

## 2012-01-21 MED ORDER — HYDROXYZINE HCL 50 MG PO TABS: 50.0000 mg | ORAL_TABLET | Freq: Four times a day (QID) | ORAL | Status: DC | PRN

## 2012-01-21 MED ORDER — EPHEDRINE 5 MG/ML INJ
10.0000 mg | INTRAVENOUS | Status: DC | PRN
Start: 2012-01-21 — End: 2012-01-22

## 2012-01-21 MED ORDER — IBUPROFEN 600 MG PO TABS
600.0000 mg | ORAL_TABLET | Freq: Four times a day (QID) | ORAL | Status: DC | PRN
  Administered 2012-01-22: 600 mg via ORAL
  Filled 2012-01-21: qty 1

## 2012-01-21 MED ORDER — LACTATED RINGERS IV SOLN
INTRAVENOUS | Status: DC
  Administered 2012-01-21 (×2): via INTRAVENOUS
  Administered 2012-01-21: 125 mL/h via INTRAVENOUS
  Administered 2012-01-22: 12:00:00 via INTRAVENOUS

## 2012-01-21 MED ORDER — EPHEDRINE 5 MG/ML INJ
10.0000 mg | INTRAVENOUS | Status: DC | PRN
Start: 2012-01-21 — End: 2012-01-22
  Filled 2012-01-21: qty 4

## 2012-01-21 NOTE — Anesthesia Procedure Notes (Signed)
Epidural Patient location during procedure: OB Start time: 01/21/2012 8:09 PM Reason for block: procedure for pain  Staffing Performed by: anesthesiologist   Preanesthetic Checklist Completed: patient identified, site marked, surgical consent, pre-op evaluation, timeout performed, IV checked, risks and benefits discussed and monitors and equipment checked  Epidural Patient position: sitting Prep: site prepped and draped and DuraPrep Patient monitoring: continuous pulse ox and blood pressure Approach: midline Injection technique: LOR air  Needle:  Needle type: Tuohy  Needle gauge: 17 G Needle length: 9 cm Needle insertion depth: 5 cm cm Catheter type: closed end flexible Catheter size: 19 Gauge Catheter at skin depth: 10 cm Test dose: negative  Assessment Events: blood not aspirated, injection not painful, no injection resistance, negative IV test and no paresthesia  Additional Notes Discussed risk of headache, infection, bleeding, nerve injury and failed or incomplete block.  Patient voices understanding and wishes to proceed.

## 2012-01-21 NOTE — Progress Notes (Signed)
Comfortable with epidural O VSS      Fhts 120s LTV mod      abd soft, gravid, nt      uc a 1--4 coupling at times      Vag 3 100 -1 VTX R mod meconium A dysfuctional labor pattern, P IV pitocin at 9 will continue to titrate, continue care, encouraged to sleep. Lavera Guise, CNM

## 2012-01-21 NOTE — Progress Notes (Signed)
Sleepy O Fhts category 1     abd soft, gravid, nt     uc q 2-3 mild to mod     Vag not assessed A SROM x10 hours P discussed IV meds encouraged to hold off on epidural until active labor pt concurs with plan of care. Lavera Guise, CNM

## 2012-01-21 NOTE — MAU Note (Signed)
Pt presents to MAU with chief complaint of rupture of membranes. Pt says she ruptured at 0900; greenish/ yellow fluid. Pt is [redacted]w[redacted]d; G1

## 2012-01-21 NOTE — Progress Notes (Signed)
Bedside u/s by ccob cnm. Infant vertex.

## 2012-01-21 NOTE — Progress Notes (Signed)
Comfortable after IV meds, agrees to Pitocin O VSS      Fhts category 1      abd soft between uc      uc q 3 mild      Vag not assessed A 40 6/7 week IUP SROM P discussed pitocin augmentation with srom and little dilitation between exams, [place] VTX per hospital pitocin protocol. Lavera Guise, CNM

## 2012-01-21 NOTE — Anesthesia Preprocedure Evaluation (Signed)

## 2012-01-21 NOTE — Progress Notes (Signed)
Stacy Cruz is a 21 y.o. G1P0000 at [redacted]w[redacted]d.   Subjective: Moderately uncomfortable w/ UC's. Coping well using comfort measures.  Objective: BP 110/60  Pulse 89  Temp(Src) 98.4 F (36.9 C) (Oral)  Resp 20  Ht 5\' 4"  (1.626 m)  Wt 85.276 kg (188 lb)  BMI 32.27 kg/m2  LMP 04/21/2011      FHT:  FHR: 115-120 bpm, variability: moderate,  accelerations:  Present,  decelerations:  Absent UC:   regular, every 2-4 minutes, moderate SVE:   Dilation: 1 Effacement (%): 80 Station: -2 Exam by:: Chesley Valls cnm  Labs: Lab Results  Component Value Date   WBC 7.3 01/21/2012   HGB 11.5* 01/21/2012   HCT 37.9 01/21/2012   MCV 72.3* 01/21/2012   PLT 191 01/21/2012    Assessment / Plan: Early labor  Labor: Progressing normally Preeclampsia:  NA Fetal Wellbeing:  Category I Pain Control:  Labor support without medications I/D:  n/a Anticipated MOD:  NSVD  Bhumi Godbey 01/21/2012, 1:47 PM

## 2012-01-21 NOTE — H&P (Signed)
HPI: Stacy Cruz is a 21 y.o. year old G24P0000 female at [redacted]w[redacted]d weeks gestation by 5 weeks [place] who presents to [facility] reporting Spontaneous rupture of membranes at 0845 and mild UC's that have gotten closer together since arrival .  Maternal Medical History:  Reason for admission: Reason for admission: rupture of membranes.  Contractions: Onset was 1-2 hours ago.   Frequency: irregular.    Fetal activity: Perceived fetal activity is normal.   Last perceived fetal movement was within the past hour.    Prenatal Complications - Diabetes: none.    Patient Active Problem List  Diagnoses  . Vaginal yeast infection  . Late prenatal care since 28 weeks  . Anemia  . Bacterial vaginosis    OB History    Grav Para Term Preterm Abortions TAB SAB Ect Mult Living   1 0 0 0 0 0 0 0 0 0      Past Medical History  Diagnosis Date  . No pertinent past medical history   . Yeast infection   . H/O candidiasis    Past Surgical History  Procedure Date  . Dental surgery 2011   Family History: family history includes Cancer in her father and mother; Diabetes in her father, maternal aunt, maternal grandfather, maternal grandmother, mother, and paternal grandmother; Heart disease in her paternal grandfather and sister; and Hypertension in her father, maternal grandfather, maternal grandmother, and mother.  There is no history of Anesthesia problems, and Hypotension, and Malignant hyperthermia, and Pseudochol deficiency, . Social History:  reports that she has never smoked. She has never used smokeless tobacco. She reports that she does not drink alcohol or use illicit drugs.  Review of Systems  Constitutional: Negative for fever and chills.  Eyes: Negative for blurred vision.  Gastrointestinal: Positive for abdominal pain (mild UC's. Denies epigastri pain.).  Neurological: Negative for headaches.      Blood pressure 131/60, pulse 81, temperature 98.3 F (36.8 C), temperature source Oral, resp.  rate 18, last menstrual period 04/21/2011. Maternal Exam:  Uterine Assessment: Contraction strength is mild.  Contraction frequency is regular.   Abdomen: Patient reports no abdominal tenderness. Fundal height is S=D.   Fetal presentation: vertex  Introitus: Normal vulva. Normal vagina.  Amniotic fluid character: meconium stained.  Pelvis: adequate for delivery.   Cervix: Cervix evaluated by digital exam.   Grossly ruptured moderate amount of fluid  Fetal Exam Fetal Monitor Review: Mode: ultrasound.   Baseline rate: 115.  Variability: minimal (<5 bpm).   Pattern: no accelerations and no decelerations.    Fetal State Assessment: Category II - tracings are indeterminate.     Physical Exam  Nursing note and vitals reviewed. Constitutional: She is oriented to person, place, and time. She appears well-developed and well-nourished. No distress.  HENT:  Head: Normocephalic.  Eyes: Pupils are equal, round, and reactive to light.  Cardiovascular: Normal rate and regular rhythm.   Respiratory: Effort normal and breath sounds normal.  GI: Soft.  Neurological: She is alert and oriented to person, place, and time. She has normal reflexes.  Skin: Skin is warm and dry.  Psychiatric: She has a normal mood and affect.    Prenatal labs: ABO, Rh: A/Positive/-- (01/08 0000) Antibody: Negative (01/08 0000) Rubella: Immune (01/08 0000) RPR: Nonreactive (01/24 0000)  HBsAg: Negative (01/08 0000)  HIV: Non-reactive (01/08 0000)  GBS: Negative (04/05 0000)  1 hour GTT 107 Genetic screening: too late  Assessment: 1. Labor: early 2. Fetal Wellbeing: Category II  3. Pain  Control: none 4. GBS: neg 5. 40.6 week IUP  Plan:  1. Admit to BS per consult with MD 2. Routine L&D orders 3. Analgesia PRN  4. Consider augmentation PRN  Dorathy Kinsman 01/21/2012, 10:54 AM

## 2012-01-22 ENCOUNTER — Encounter (HOSPITAL_COMMUNITY): Payer: Self-pay | Admitting: Obstetrics and Gynecology

## 2012-01-22 MED ORDER — ONDANSETRON HCL 4 MG/2ML IJ SOLN: 4.0000 mg | INTRAMUSCULAR | Status: DC | PRN

## 2012-01-22 MED ORDER — WITCH HAZEL-GLYCERIN EX PADS
1.0000 "application " | MEDICATED_PAD | CUTANEOUS | Status: DC | PRN
  Administered 2012-01-23: 1 via TOPICAL

## 2012-01-22 MED ORDER — PRENATAL MULTIVITAMIN CH
1.0000 | ORAL_TABLET | Freq: Every day | ORAL | Status: DC
  Administered 2012-01-22 – 2012-01-24 (×3): 1 via ORAL
  Filled 2012-01-22 (×3): qty 1

## 2012-01-22 MED ORDER — BENZOCAINE-MENTHOL 20-0.5 % EX AERO
1.0000 "application " | INHALATION_SPRAY | CUTANEOUS | Status: DC | PRN
  Administered 2012-01-23: 1 via TOPICAL
  Filled 2012-01-22: qty 56

## 2012-01-22 MED ORDER — DIPHENHYDRAMINE HCL 25 MG PO CAPS: 25.0000 mg | ORAL_CAPSULE | Freq: Four times a day (QID) | ORAL | Status: DC | PRN

## 2012-01-22 MED ORDER — ONDANSETRON HCL 4 MG PO TABS: 4.0000 mg | ORAL_TABLET | ORAL | Status: DC | PRN

## 2012-01-22 MED ORDER — TETANUS-DIPHTH-ACELL PERTUSSIS 5-2.5-18.5 LF-MCG/0.5 IM SUSP
0.5000 mL | Freq: Once | INTRAMUSCULAR | Status: AC
  Administered 2012-01-23: 0.5 mL via INTRAMUSCULAR
  Filled 2012-01-22: qty 0.5

## 2012-01-22 MED ORDER — SIMETHICONE 80 MG PO CHEW
80.0000 mg | CHEWABLE_TABLET | ORAL | Status: DC | PRN
  Administered 2012-01-23: 80 mg via ORAL

## 2012-01-22 MED ORDER — DIBUCAINE 1 % RE OINT: 1.0000 "application " | TOPICAL_OINTMENT | RECTAL | Status: DC | PRN

## 2012-01-22 MED ORDER — ZOLPIDEM TARTRATE 5 MG PO TABS: 5.0000 mg | ORAL_TABLET | Freq: Every evening | ORAL | Status: DC | PRN

## 2012-01-22 MED ORDER — IBUPROFEN 600 MG PO TABS
600.0000 mg | ORAL_TABLET | Freq: Four times a day (QID) | ORAL | Status: DC
  Administered 2012-01-22 – 2012-01-24 (×7): 600 mg via ORAL
  Filled 2012-01-22 (×6): qty 1

## 2012-01-22 MED ORDER — LANOLIN HYDROUS EX OINT: TOPICAL_OINTMENT | CUTANEOUS | Status: DC | PRN

## 2012-01-22 MED ORDER — OXYCODONE-ACETAMINOPHEN 5-325 MG PO TABS
1.0000 | ORAL_TABLET | ORAL | Status: DC | PRN
  Administered 2012-01-23 (×2): 1 via ORAL
  Administered 2012-01-24: 2 via ORAL
  Filled 2012-01-22 (×2): qty 1
  Filled 2012-01-22: qty 2

## 2012-01-22 MED ORDER — SENNOSIDES-DOCUSATE SODIUM 8.6-50 MG PO TABS
2.0000 | ORAL_TABLET | Freq: Every day | ORAL | Status: DC
  Administered 2012-01-22 – 2012-01-23 (×2): 2 via ORAL

## 2012-01-22 NOTE — Progress Notes (Signed)
Subjective: Comfortable.  Reports has napped some. S.o. And 2 other female visitors at bedside.  Pitocin on 2mu/min.  Objective: BP 118/78  Pulse 91  Temp(Src) 98.6 F (37 C) (Oral)  Resp 18  Ht 5\' 4"  (1.626 m)  Wt 85.276 kg (188 lb)  BMI 32.27 kg/m2  SpO2 100%  LMP 04/21/2011   Total I/O In: -  Out: 650 [Urine:650]  FHT:  FHR: 125 bpm, variability: moderate,  accelerations:  Present,  decelerations:  Absent UC:   regular, every 2-4 minutes SVE:   Loose 3/90/-1; molding noted; anterior; IUPC easily inserted; small amt of bloody show on glove; mod amt on towel. Labs: Lab Results  Component Value Date   WBC 7.3 01/21/2012   HGB 11.5* 01/21/2012   HCT 37.9 01/21/2012   MCV 72.3* 01/21/2012   PLT 191 01/21/2012    Assessment / Plan: 1.  41 weeks 2.  SROM 0845 4/21  3.  GBS neg  4. latent labor  Labor: latent Preeclampsia:  no signs or symptoms of toxicity Fetal Wellbeing:  Category I Pain Control:  Epidural I/D:  n/a Anticipated MOD:  NSVD 1.  Titrate Pitocin to achieve adequacy 2.  Enc'd at least hrly position changes 3.  C/w MD prn Aedyn Mckeon H 01/22/2012, 12:00 AM

## 2012-01-22 NOTE — Anesthesia Postprocedure Evaluation (Signed)
  Anesthesia Post-op Note  Patient: Stacy Cruz  Procedure(s) Performed: * No procedures listed *  Patient Location: PACU and Mother/Baby  Anesthesia Type: Epidural  Level of Consciousness: awake, alert  and oriented  Airway and Oxygen Therapy: Patient Spontanous Breathing  Post-op Pain: mild  Post-op Assessment: Post-op Vital signs reviewed  Post-op Vital Signs: Reviewed and stable  Complications: No apparent anesthesia complications

## 2012-01-22 NOTE — Progress Notes (Signed)
provider at the bedside and reviewed strip. Orders received to cut pitocin level back to 11. Will contiue to monitor,

## 2012-01-22 NOTE — Progress Notes (Signed)
C/o urge to push, calm, quiet O VSS afebrile     fhs 120s LTV min to mod     uc q 2 mod     Vag C +1     Scalp electrode placed easily A 2nd stage P continue pushing, Dr. Estanislado Pandy updated per telephone on pt status at 0900 Lavera Guise, CNM

## 2012-01-22 NOTE — Progress Notes (Signed)
Subjective: Pt awoke and reporting vaginal pressure and back pain.  S.o. In bed with pt on my arrival to room, and pt's mom on stool at her side of the bed.    Objective: BP 120/65  Pulse 103  Temp(Src) 99.3 F (37.4 C) (Oral)  Resp 20  Ht 5\' 4"  (1.626 m)  Wt 85.276 kg (188 lb)  BMI 32.27 kg/m2  SpO2 100%  LMP 04/21/2011   Total I/O In: -  Out: 650 [Urine:650]  FHT:  FHR: 125 bpm, variability: moderate,  accelerations:  Present,  decelerations:  Absent UC:   regular, every 1-4 minutes; couplet, triplet, and quadruplet pattern noted SVE:   Dilation: Lip/rim Effacement (%): 90 Station: +1 Exam by:: H. Jnya Brossard CNM adequat MVU's.  cx only anterior between 9 o'clock an 3 o'clock.   High suspicion for OP presentation Labs: Lab Results  Component Value Date   WBC 7.3 01/21/2012   HGB 11.5* 01/21/2012   HCT 37.9 01/21/2012   MCV 72.3* 01/21/2012   PLT 191 01/21/2012    Assessment / Plan: 1.  41 weeks  2.  Likely OP  3. thick Anterior Lip  4.  GBS neg  5.  MSF    Labor: Transition Preeclampsia:  no signs or symptoms of toxicity Fetal Wellbeing:  Category I Pain Control:  Epidural I/D:  n/a Anticipated MOD:  NSVD 1.  Pt repositioned into Rt exaggerated Sims and enc'd Lt Exaggerated in 45-60 min; may need to try H-N-K 2.  Pitocin turned from 13 to 11mu while I was at bedside b/c some quadruplet patterns rendering ctxs every min 3.  Labor down  4.  C/w MD prn; possible CPD if rotation doesn't occur. Yeray Tomas H 01/22/2012, 6:33 AM

## 2012-01-22 NOTE — Progress Notes (Signed)
Subjective: Pt c/o of pressure and RN called me to bedside around 0220.  MSF still noted w/ some show.  Pitocin on 13 mu.  Family awake at bedside.  Pt has been changing position from side to side. Objective: BP 124/74  Pulse 99  Temp(Src) 98.8 F (37.1 C) (Oral)  Resp 18  Ht 5\' 4"  (1.626 m)  Wt 85.276 kg (188 lb)  BMI 32.27 kg/m2  SpO2 100%  LMP 04/21/2011   Total I/O In: -  Out: 650 [Urine:650]  FHT:  FHR: 125 bpm, variability: moderate,  accelerations:  Present,  decelerations:  Absent UC:   regular, every 1.5-3 minutes some couplets and triplets at times SVE:   Dilation: 5 Effacement (%): 90 Station: -1 Exam by:: H. Josselin Gaulin CNM Molding noted still.  MVU's adeq since IUPC inserted and range 185-250 Labs: Lab Results  Component Value Date   WBC 7.3 01/21/2012   HGB 11.5* 01/21/2012   HCT 37.9 01/21/2012   MCV 72.3* 01/21/2012   PLT 191 01/21/2012    Assessment / Plan: 1.  41 weeks  2.  Augmentation secondary to SROM at 0845 4/21  3.  GBS neg    Labor: active labor now with adequate MVU's Preeclampsia:  no signs or symptoms of toxicity Fetal Wellbeing:  Category I Pain Control:  Epidural I/D:  n/a Anticipated MOD:  NSVD 1.  Continue current POC 2.  C/w MD prn Tierria Watson H 01/22/2012, 2:40 AM

## 2012-01-22 NOTE — Progress Notes (Signed)
NST reactive.

## 2012-01-22 NOTE — Progress Notes (Signed)
Some L sided uc pain O VSS afebrile     fhts 120s LTv min     uc q 2-3 coupling, adequate MVU 190-240     Vag C 0 VTX A 2nd stage SROM x 24 hours P labor down Lavera Guise, CNM

## 2012-01-23 LAB — CBC
HCT: 31.8 % — ABNORMAL LOW (ref 36.0–46.0)
Hemoglobin: 9.5 g/dL — ABNORMAL LOW (ref 12.0–15.0)
MCH: 21.8 pg — ABNORMAL LOW (ref 26.0–34.0)
MCHC: 29.9 g/dL — ABNORMAL LOW (ref 30.0–36.0)
MCV: 72.9 fL — ABNORMAL LOW (ref 78.0–100.0)

## 2012-01-23 NOTE — Progress Notes (Signed)
UR chart review completed.  

## 2012-01-23 NOTE — Progress Notes (Signed)
S: comfortable, little bleeding, slept little     Breastfeeding with assistance O VSS     abd soft, nt, ff      sm  flowperineum clean intact     -Homans sign bilaterally,       No edema Results for orders placed during the hospital encounter of 01/21/12 (from the past 48 hour(s))  CBC     Status: Abnormal   Collection Time   01/21/12 11:13 AM      Component Value Range Comment   WBC 7.3  4.0 - 10.5 (K/uL)    RBC 5.24 (*) 3.87 - 5.11 (MIL/uL)    Hemoglobin 11.5 (*) 12.0 - 15.0 (g/dL)    HCT 40.9  81.1 - 91.4 (%)    MCV 72.3 (*) 78.0 - 100.0 (fL)    MCH 21.9 (*) 26.0 - 34.0 (pg)    MCHC 30.3  30.0 - 36.0 (g/dL)    RDW 78.2 (*) 95.6 - 15.5 (%)    Platelets 191  150 - 400 (K/uL)   RPR     Status: Normal   Collection Time   01/21/12 11:13 AM      Component Value Range Comment   RPR NON REACTIVE  NON REACTIVE    CBC     Status: Abnormal   Collection Time   01/23/12  5:20 AM      Component Value Range Comment   WBC 31.4 (*) 4.0 - 10.5 (K/uL)    RBC 4.36  3.87 - 5.11 (MIL/uL)    Hemoglobin 9.5 (*) 12.0 - 15.0 (g/dL)    HCT 21.3 (*) 08.6 - 46.0 (%)    MCV 72.9 (*) 78.0 - 100.0 (fL)    MCH 21.8 (*) 26.0 - 34.0 (pg)    MCHC 29.9 (*) 30.0 - 36.0 (g/dL)    RDW 57.8 (*) 46.9 - 15.5 (%)    Platelets 167  150 - 400 (K/uL)    A normal involution     Lactating     Pp day 1 P continue care, reviewed birth control options undecide. Lavera Guise, CNM

## 2012-01-24 ENCOUNTER — Encounter (HOSPITAL_COMMUNITY): Payer: Self-pay | Admitting: Advanced Practice Midwife

## 2012-01-24 NOTE — Discharge Summary (Signed)
Obstetric Discharge Summary Reason for Admission: onset of labor and rupture of membranes Prenatal Procedures: ultrasound Intrapartum Procedures: spontaneous vaginal delivery Postpartum Procedures: none Complications-Operative and Postpartum: vaginal laceration Hemoglobin  Date Value Range Status  01/23/2012 9.5* 12.0-15.0 (g/dL) Final     DELTA CHECK NOTED     REPEATED TO VERIFY     HCT  Date Value Range Status  01/23/2012 31.8* 36.0-46.0 (%) Final    Physical Exam:  General: alert, cooperative and no distress Lochia: appropriate Uterine Fundus: firm Incision: NA DVT Evaluation: Negative Homan's sign. No significant calf/ankle edema.  Discharge Diagnoses: Term Pregnancy-delivered  Discharge Information: Date: 01/24/2012 Activity: pelvic rest Diet: routine and iron-rich foods Medications: Ibuprofen and Micronor Condition: stable Instructions: refer to practice specific booklet Discharge to: home Follow-up Information    Follow up with [facility] in 6 weeks. (or [facility] as needed)    Contact information:   [address] [place] [address]          Newborn Data: Live born female  Birth Weight: 7 lb 2 oz (3232 g) APGAR: 8, 8  Home with mother. Breastfeeding Contraception: Micronor  Stacy Cruz 01/24/2012, 8:46 AM

## 2012-01-29 ENCOUNTER — Encounter: Payer: Medicaid Other | Admitting: Obstetrics and Gynecology

## 2012-03-04 ENCOUNTER — Ambulatory Visit: Payer: Medicaid Other | Admitting: Obstetrics and Gynecology

## 2012-03-13 ENCOUNTER — Encounter: Payer: Self-pay | Admitting: Obstetrics and Gynecology

## 2012-03-13 ENCOUNTER — Ambulatory Visit (INDEPENDENT_AMBULATORY_CARE_PROVIDER_SITE_OTHER): Payer: Medicaid Other | Admitting: Obstetrics and Gynecology

## 2012-03-13 MED ORDER — NORETHINDRONE ACET-ETHINYL EST 1-20 MG-MCG PO TABS: 1.0000 | ORAL_TABLET | Freq: Every day | ORAL | Status: DC

## 2012-03-13 NOTE — Progress Notes (Deleted)
Stacy Cruz  is  postpartum following a vaginal delivery born on 01/22/2011 .Name: Stacy Cruz   Breastfeeding: no Bottlefeeding:  yes  Post-partum blues / depression:  no  EPDS score: History of abnormal Pap: No  Last Pap: Date   Gestational diabetes:  {yes no free text:314490}  Contraception:  Desires {PLAN CONTRACEPTION:313102}  Normal urinary function:  {yes no free text:314490} Normal GI function:  {yes no free text:314490} Returning to work:  {yes no free [phone]

## 2012-03-13 NOTE — Progress Notes (Signed)
Stacy Cruz  is 6 weeks postpartum following a  Vaginal Deliveryat 41weeks and 1 day gestational weeks Date: [birth year]/2012 Female named Stacy Cruz by Melina Fiddler CNM  Breastfeeding: no Bottlefeeding:  yes  Post-partum blues / depression:  no  EPDS score: 8  History of abnormal Pap:  no  Last WUJ:WJXB Gestational diabetes:  no  Contraception:  Desires oral progesterone-only contraceptive  Normal urinary function:  yes Normal GI function:  yes Returning to work:  No  Subjective:     Stacy Cruz is a 21 y.o. female who presents for a postpartum visit.  I have fully reviewed the prenatal and intrapartum course.    Patient is not sexually active.   The following portions of the patient's history were reviewed and updated as appropriate: allergies, current medications, past family history, past medical history, past social history, past surgical history and problem list.  Review of Systems Pertinent items are noted in HPI.   Objective:    BP 108/60  Resp 16  Wt 167 lb (75.751 kg)  LMP 02/11/2012  General:  alert, cooperative and no distress     Lungs: clear to auscultation bilaterally  Heart:  regular rate and rhythm, S1, S2 normal, no murmur  Abdomen: soft, non-tender; bowel sounds normal; no masses,  no organomegaly   Vulva:  normal  Vagina: normal vagina  Cervix:  normal  Corpus: normal size, contour, position, consistency, mobility, non-tender  Adnexa:  normal adnexa             Assessment:     Normal postpartum exam.  Pap smear not done at today's visit.   Plan:    JYNWGN,FAOZHY A MD 03/13/2012 12:21 PM

## 2012-07-15 ENCOUNTER — Emergency Department (HOSPITAL_COMMUNITY): Payer: Self-pay

## 2012-07-15 ENCOUNTER — Emergency Department (HOSPITAL_COMMUNITY)
Admission: EM | Admit: 2012-07-15 | Discharge: 2012-07-15 | Disposition: A | Discharge status: Home / Self Care | Payer: Self-pay | Attending: Emergency Medicine | Admitting: Emergency Medicine

## 2012-07-15 ENCOUNTER — Encounter (HOSPITAL_COMMUNITY): Payer: Self-pay | Admitting: *Deleted

## 2012-07-15 DIAGNOSIS — N939 Abnormal uterine and vaginal bleeding, unspecified: Secondary | ICD-10-CM

## 2012-07-15 DIAGNOSIS — N83209 Unspecified ovarian cyst, unspecified side: Secondary | ICD-10-CM | POA: Insufficient documentation

## 2012-07-15 DIAGNOSIS — R109 Unspecified abdominal pain: Secondary | ICD-10-CM | POA: Insufficient documentation

## 2012-07-15 DIAGNOSIS — N898 Other specified noninflammatory disorders of vagina: Secondary | ICD-10-CM | POA: Insufficient documentation

## 2012-07-15 LAB — URINALYSIS, ROUTINE W REFLEX MICROSCOPIC
Bilirubin Urine: NEGATIVE
Glucose, UA: NEGATIVE mg/dL
Ketones, ur: NEGATIVE mg/dL
pH: 6 (ref 5.0–8.0)

## 2012-07-15 LAB — POCT PREGNANCY, URINE: Preg Test, Ur: NEGATIVE

## 2012-07-15 LAB — URINE MICROSCOPIC-ADD ON

## 2012-07-15 LAB — WET PREP, GENITAL: Clue Cells Wet Prep HPF POC: NONE SEEN

## 2012-07-15 MED ORDER — IBUPROFEN 800 MG PO TABS
800.0000 mg | ORAL_TABLET | Freq: Once | ORAL | Status: AC
  Administered 2012-07-15: 800 mg via ORAL
  Filled 2012-07-15: qty 1

## 2012-07-15 NOTE — ED Provider Notes (Signed)
Medical screening examination/treatment/procedure(s) were conducted as a shared visit with non-physician practitioner(s) and myself.  I personally evaluated the patient during the encounter.   Tender R pelvic region not in area of McBurneys point and no acute ABd serial evaluations. US shows hemorrhagic cyst and with US findings do not feel CT scan indicated to evaluate appendix. Improved condition with medication. Plan d/c home with ABD pain precautions verbalized as understood. reliable historian. Labs reviewed.   Results for orders placed during the hospital encounter of 07/15/12  URINALYSIS, ROUTINE W REFLEX MICROSCOPIC      Component Value Range   Color, Urine YELLOW  YELLOW   APPearance CLOUDY (*) CLEAR   Specific Gravity, Urine 1.043 (*) 1.005 - 1.030   pH 6.0  5.0 - 8.0   Glucose, UA NEGATIVE  NEGATIVE mg/dL   Hgb urine dipstick SMALL (*) NEGATIVE   Bilirubin Urine NEGATIVE  NEGATIVE   Ketones, ur NEGATIVE  NEGATIVE mg/dL   Protein, ur NEGATIVE  NEGATIVE mg/dL   Urobilinogen, UA 0.2  0.0 - 1.0 mg/dL   Nitrite NEGATIVE  NEGATIVE   Leukocytes, UA NEGATIVE  NEGATIVE  WET PREP, GENITAL      Component Value Range   Yeast Wet Prep HPF POC NONE SEEN  NONE SEEN   Trich, Wet Prep NONE SEEN  NONE SEEN   Clue Cells Wet Prep HPF POC NONE SEEN  NONE SEEN   WBC, Wet Prep HPF POC FEW (*) NONE SEEN  POCT PREGNANCY, URINE      Component Value Range   Preg Test, Ur NEGATIVE  NEGATIVE  URINE MICROSCOPIC-ADD ON      Component Value Range   Squamous Epithelial / LPF FEW (*) RARE   WBC, UA 7-10  <3 WBC/hpf   RBC / HPF 3-6  <3 RBC/hpf   Bacteria, UA FEW (*) RARE   Urine-Other MUCOUS PRESENT     US Transvaginal Non-ob  07/15/2012  *RADIOLOGY REPORT*  Clinical Data: Right lower quadrant abdominal and pelvic pain for 2 days.  TRANSABDOMINAL AND TRANSVAGINAL ULTRASOUND OF PELVIS Technique:  Both transabdominal and transvaginal ultrasound examinations of the pelvis were performed. Transabdominal  technique was performed for global imaging of the pelvis including uterus, ovaries, adnexal regions, and pelvic cul-de-sac.  It was necessary to proceed with endovaginal exam following the transabdominal exam to visualize the endometrium.  Comparison:  None  Findings:  Uterus: The uterus is anteverted and measures 7.2 x 3.7 x 5.1 cm. No focal myometrial masses.  Endometrium: Endometrial stripe thickness is normal at 6 mm.  No abnormal endometrial fluid collections.  Right ovary:  The right ovary measures 5 x 4 x 4.8 cm. Flow is demonstrated in the right ovary on color flow Doppler imaging. There is a hyperechoic area in the right ovary measuring 3.8 x 3.8 x 2.8 cm.  This area does not demonstrate flow on color flow Doppler imaging.  This is in the region of pain.  Findings most likely represent a hemorrhagic cyst.  Left ovary: Left ovary measures 3.5 x 2.3 x 1.9 cm.  Normal follicular changes.  Flow is demonstrated in the left ovary on color flow Doppler imaging.  Other findings: No free pelvic fluid collections.  IMPRESSION: The right ovary contains a hyperechoic avascular structure likely representing hemorrhagic cyst.  The uterus and left ovary are normal in appearance.   Original Report Authenticated By: Marlon Pel, M.D.    US Pelvis Complete  07/15/2012  *RADIOLOGY REPORT*  Clinical Data: Right  lower quadrant abdominal and pelvic pain for 2 days.  TRANSABDOMINAL AND TRANSVAGINAL ULTRASOUND OF PELVIS Technique:  Both transabdominal and transvaginal ultrasound examinations of the pelvis were performed. Transabdominal technique was performed for global imaging of the pelvis including uterus, ovaries, adnexal regions, and pelvic cul-de-sac.  It was necessary to proceed with endovaginal exam following the transabdominal exam to visualize the endometrium.  Comparison:  None  Findings:  Uterus: The uterus is anteverted and measures 7.2 x 3.7 x 5.1 cm. No focal myometrial masses.  Endometrium: Endometrial  stripe thickness is normal at 6 mm.  No abnormal endometrial fluid collections.  Right ovary:  The right ovary measures 5 x 4 x 4.8 cm. Flow is demonstrated in the right ovary on color flow Doppler imaging. There is a hyperechoic area in the right ovary measuring 3.8 x 3.8 x 2.8 cm.  This area does not demonstrate flow on color flow Doppler imaging.  This is in the region of pain.  Findings most likely represent a hemorrhagic cyst.  Left ovary: Left ovary measures 3.5 x 2.3 x 1.9 cm.  Normal follicular changes.  Flow is demonstrated in the left ovary on color flow Doppler imaging.  Other findings: No free pelvic fluid collections.  IMPRESSION: The right ovary contains a hyperechoic avascular structure likely representing hemorrhagic cyst.  The uterus and left ovary are normal in appearance.   Original Report Authenticated By: Marlon Pel, M.D.       Sunnie Nielsen, MD 07/15/12 [phone]

## 2012-07-15 NOTE — ED Notes (Signed)
PT not available for hourly rounding. In [place]

## 2012-07-15 NOTE — ED Provider Notes (Signed)
History     CSN: [id number]  Arrival date & time 07/15/12  0047   First MD Initiated Contact with Patient 07/15/12 0118      Chief Complaint  Patient presents with  . Abdominal Pain    (Consider location/radiation/quality/duration/timing/severity/associated sxs/prior treatment) HPI Comments: 21 y/o female presents to the ED with sudden onset right lower abdominal pain yesterday evening. She was at work when the pain began. Describes the pain as sharp, non radiating, rated 8/10, worse with palpation and deep inspiration. She tried taking tylenol without much relief of her pain. Admits to associated vaginal bleeding beginning last night. Her last menstrual was 2 weeks ago and was normal for her. Gave birth vaginally 5 months ago and has had a few normal cycles since. She is not breast feeding. Vaginal bleeding described as looking like her menstrual period. Denies nausea, vomiting, back pain, fever, chills.   The history is provided by the patient.    Past Medical History  Diagnosis Date  . BV (bacterial vaginosis)     H/O  . H/O candidiasis   . Anemia     Past Surgical History  Procedure Date  . Wisdom tooth extraction 2011    Family History  Problem Relation Age of Onset  . Anesthesia problems Neg Hx   . Hypotension Neg Hx   . Malignant hyperthermia Neg Hx   . Pseudochol deficiency Neg Hx   . Cancer Mother   . Cancer Father   . Heart disease Sister   . Hypertension Maternal Grandmother   . Diabetes Maternal Grandmother   . Hypertension Maternal Grandfather   . Diabetes Maternal Grandfather   . Diabetes Paternal Grandmother   . Hypertension Paternal Grandmother   . Heart disease Paternal Grandfather   . Hypertension Paternal Grandfather   . Diabetes Maternal Uncle     History  Substance Use Topics  . Smoking status: Never Smoker   . Smokeless tobacco: Never Used  . Alcohol Use: No    OB History    Grav Para Term Preterm Abortions TAB SAB Ect Mult Living   1 1 1  0 0 0 0 0 0 1      Review of Systems  Constitutional: Negative for fever and chills.  HENT: Negative for neck pain.   Respiratory: Negative for shortness of breath.   Cardiovascular: Negative for chest pain.  Gastrointestinal: Positive for abdominal pain. Negative for nausea and vomiting.  Genitourinary: Positive for vaginal bleeding. Negative for dysuria, frequency, hematuria, flank pain, vaginal discharge and vaginal pain.  Musculoskeletal: Negative for back pain.  Skin: Negative for color change and rash.  Neurological: Negative for weakness.  Psychiatric/Behavioral: The patient is not nervous/anxious.     Allergies  Review of patient's allergies indicates no known allergies.  Home Medications   Current Outpatient Rx  Name Route Sig Dispense Refill  . ACETAMINOPHEN 325 MG PO TABS Oral Take 650 mg by mouth every 6 (six) hours as needed. For pain    . NORETHINDRONE ACET-ETHINYL EST 1-20 MG-MCG PO TABS Oral Take 1 tablet by mouth daily. 1 Package 11    BP 127/86  Pulse 83  Temp 98.7 F (37.1 C) (Oral)  Resp 18  SpO2 100%  LMP 06/28/2012  Breastfeeding? No  Physical Exam  Nursing note and vitals reviewed. Constitutional: She is oriented to person, place, and time. Vital signs are normal. She appears well-developed and well-nourished. No distress.  HENT:  Head: Normocephalic and atraumatic.  Eyes: Conjunctivae normal and EOM  are normal.  Neck: Normal range of motion. Neck supple.  Cardiovascular: Normal rate, regular rhythm and normal heart sounds.   Pulmonary/Chest: Effort normal and breath sounds normal. No respiratory distress.  Abdominal: Soft. Bowel sounds are normal. She exhibits no mass. There is tenderness in the right lower quadrant and suprapubic area. There is no rigidity, no rebound, no guarding, no CVA tenderness, no tenderness at McBurney's point and negative Murphy's sign.  Genitourinary: Uterus is tender. Cervix exhibits discharge (thick bloody).  Cervix exhibits no motion tenderness and no friability. Right adnexum displays tenderness. Right adnexum displays no mass and no fullness. Left adnexum displays no mass, no tenderness and no fullness. There is bleeding around the vagina. No erythema or tenderness around the vagina. Vaginal discharge (bloody) found.  Musculoskeletal: Normal range of motion.  Neurological: She is alert and oriented to person, place, and time.  Skin: Skin is warm, dry and intact. No rash noted.  Psychiatric: She has a normal mood and affect. Her speech is normal and behavior is normal.    ED Course  Procedures (including critical care time)   Labs Reviewed  URINALYSIS, ROUTINE W REFLEX MICROSCOPIC  WET PREP, GENITAL  GC/CHLAMYDIA PROBE AMP, GENITAL  POCT PREGNANCY, URINE   Results for orders placed during the hospital encounter of 07/15/12  URINALYSIS, ROUTINE W REFLEX MICROSCOPIC      Component Value Range   Color, Urine YELLOW  YELLOW   APPearance CLOUDY (*) CLEAR   Specific Gravity, Urine 1.043 (*) 1.005 - 1.030   pH 6.0  5.0 - 8.0   Glucose, UA NEGATIVE  NEGATIVE mg/dL   Hgb urine dipstick SMALL (*) NEGATIVE   Bilirubin Urine NEGATIVE  NEGATIVE   Ketones, ur NEGATIVE  NEGATIVE mg/dL   Protein, ur NEGATIVE  NEGATIVE mg/dL   Urobilinogen, UA 0.2  0.0 - 1.0 mg/dL   Nitrite NEGATIVE  NEGATIVE   Leukocytes, UA NEGATIVE  NEGATIVE  WET PREP, GENITAL      Component Value Range   Yeast Wet Prep HPF POC NONE SEEN  NONE SEEN   Trich, Wet Prep NONE SEEN  NONE SEEN   Clue Cells Wet Prep HPF POC NONE SEEN  NONE SEEN   WBC, Wet Prep HPF POC FEW (*) NONE SEEN  POCT PREGNANCY, URINE      Component Value Range   Preg Test, Ur NEGATIVE  NEGATIVE  URINE MICROSCOPIC-ADD ON      Component Value Range   Squamous Epithelial / LPF FEW (*) RARE   WBC, UA 7-10  <3 WBC/hpf   RBC / HPF 3-6  <3 RBC/hpf   Bacteria, UA FEW (*) RARE   Urine-Other MUCOUS PRESENT      US Transvaginal Non-ob  07/15/2012  *RADIOLOGY  REPORT*  Clinical Data: Right lower quadrant abdominal and pelvic pain for 2 days.  TRANSABDOMINAL AND TRANSVAGINAL ULTRASOUND OF PELVIS Technique:  Both transabdominal and transvaginal ultrasound examinations of the pelvis were performed. Transabdominal technique was performed for global imaging of the pelvis including uterus, ovaries, adnexal regions, and pelvic cul-de-sac.  It was necessary to proceed with endovaginal exam following the transabdominal exam to visualize the endometrium.  Comparison:  None  Findings:  Uterus: The uterus is anteverted and measures 7.2 x 3.7 x 5.1 cm. No focal myometrial masses.  Endometrium: Endometrial stripe thickness is normal at 6 mm.  No abnormal endometrial fluid collections.  Right ovary:  The right ovary measures 5 x 4 x 4.8 cm. Flow is demonstrated in the  right ovary on color flow Doppler imaging. There is a hyperechoic area in the right ovary measuring 3.8 x 3.8 x 2.8 cm.  This area does not demonstrate flow on color flow Doppler imaging.  This is in the region of pain.  Findings most likely represent a hemorrhagic cyst.  Left ovary: Left ovary measures 3.5 x 2.3 x 1.9 cm.  Normal follicular changes.  Flow is demonstrated in the left ovary on color flow Doppler imaging.  Other findings: No free pelvic fluid collections.  IMPRESSION: The right ovary contains a hyperechoic avascular structure likely representing hemorrhagic cyst.  The uterus and left ovary are normal in appearance.   Original Report Authenticated By: Marlon Pel, M.D.    US Pelvis Complete  07/15/2012  *RADIOLOGY REPORT*  Clinical Data: Right lower quadrant abdominal and pelvic pain for 2 days.  TRANSABDOMINAL AND TRANSVAGINAL ULTRASOUND OF PELVIS Technique:  Both transabdominal and transvaginal ultrasound examinations of the pelvis were performed. Transabdominal technique was performed for global imaging of the pelvis including uterus, ovaries, adnexal regions, and pelvic cul-de-sac.  It was  necessary to proceed with endovaginal exam following the transabdominal exam to visualize the endometrium.  Comparison:  None  Findings:  Uterus: The uterus is anteverted and measures 7.2 x 3.7 x 5.1 cm. No focal myometrial masses.  Endometrium: Endometrial stripe thickness is normal at 6 mm.  No abnormal endometrial fluid collections.  Right ovary:  The right ovary measures 5 x 4 x 4.8 cm. Flow is demonstrated in the right ovary on color flow Doppler imaging. There is a hyperechoic area in the right ovary measuring 3.8 x 3.8 x 2.8 cm.  This area does not demonstrate flow on color flow Doppler imaging.  This is in the region of pain.  Findings most likely represent a hemorrhagic cyst.  Left ovary: Left ovary measures 3.5 x 2.3 x 1.9 cm.  Normal follicular changes.  Flow is demonstrated in the left ovary on color flow Doppler imaging.  Other findings: No free pelvic fluid collections.  IMPRESSION: The right ovary contains a hyperechoic avascular structure likely representing hemorrhagic cyst.  The uterus and left ovary are normal in appearance.   Original Report Authenticated By: Marlon Pel, M.D.      1. Hemorrhagic ovarian cyst   2. Abdominal pain   3. Vaginal bleeding       MDM  21 year old female with right lower quadrant abdominal pain and vaginal bleeding. Hemorrhagic cyst seen on pelvic ultrasound. No ovarian torsion. She had no cervical motion tenderness on exam. Her gynecologist is Dr. Normand Sloop who she will call first thing in the morning. She is in no apparent distress and states her abdominal pain is beginning to subside. Return precautions discussed.        Trevor Mace, PA-C 07/15/12 [phone]

## 2012-07-15 NOTE — ED Notes (Signed)
Pt returned from US

## 2012-07-15 NOTE — ED Notes (Addendum)
C/o R side abd pain, onset Sunday, LMP 2 weeks ago, delivered baby 5 months ago, "cycle not regular yet", (denies: nvd, fever, bleeding, urinary or vaginal sx), last ate 2030, last BM 1230 (normal). Pinpoints pain to mid R side, pain worse with applying pressure or during inspiration.

## 2012-07-16 LAB — GC/CHLAMYDIA PROBE AMP, GENITAL: Chlamydia, DNA Probe: POSITIVE — AB

## 2012-07-19 NOTE — ED Notes (Signed)
Chart returned from EDP office. Give doxycyline 100 mg po BID x 7 days.All sexual partners must be tested and treated .No sexual activity x 10 days following last person treated per [facility] PA-C.

## 2012-07-20 ENCOUNTER — Telehealth (HOSPITAL_COMMUNITY): Payer: Self-pay | Admitting: *Deleted

## 2012-07-21 ENCOUNTER — Telehealth (HOSPITAL_COMMUNITY): Payer: Self-pay | Admitting: Emergency Medicine

## 2012-07-23 NOTE — ED Notes (Signed)
Voice mail message left for patient to return call. 

## 2012-07-26 NOTE — ED Notes (Signed)
Unable to contact via phone letter mailed to [facility] address. 

## 2012-08-25 NOTE — ED Notes (Signed)
No response to letter sent after 30 days. Chart sent to Medical Records per protocol. °

## 2013-04-18 IMAGING — US US PELVIS COMPLETE
1 series · 13 of 25 positions shown · non-contrast
Comparison: None

CLINICAL DATA: Right lower quadrant abdominal and pelvic pain for 2
days.



[Series 1: us pelvis complete · 0.22mm/px · 82 acquisitions, 13 frames shown]
[im 1/82]
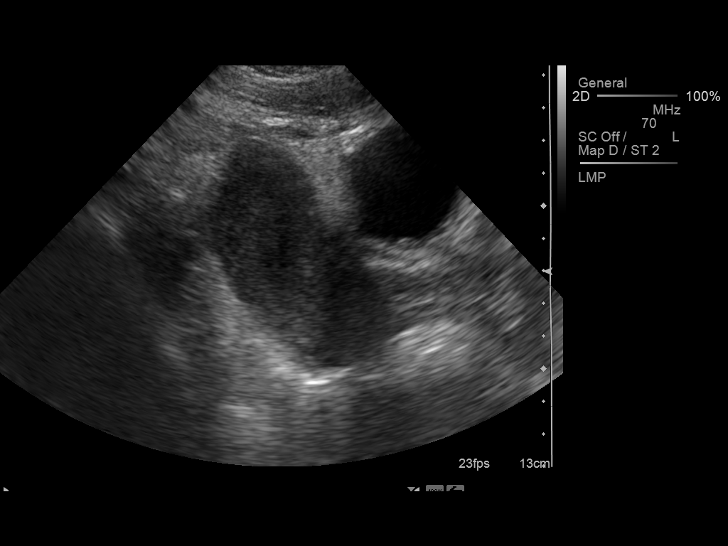
[im 7/82]
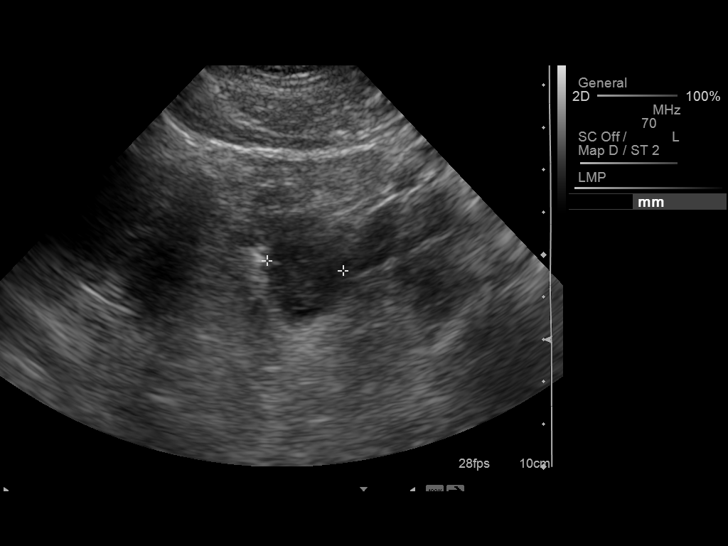
[im 14/82]
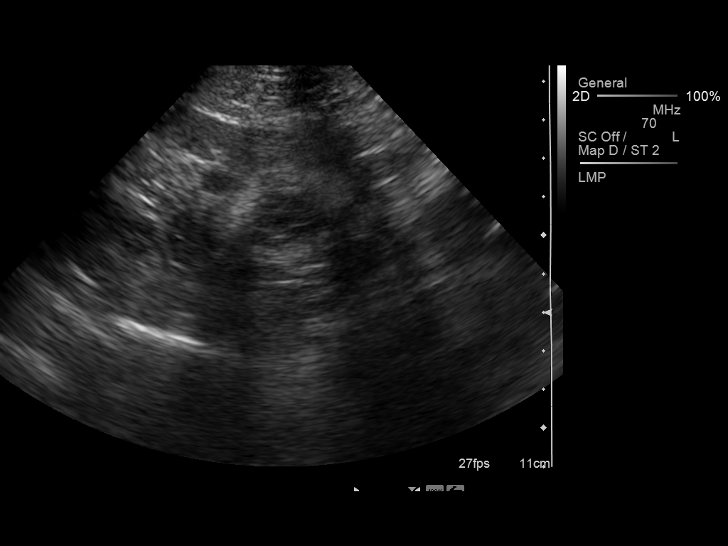
[im 21/82]
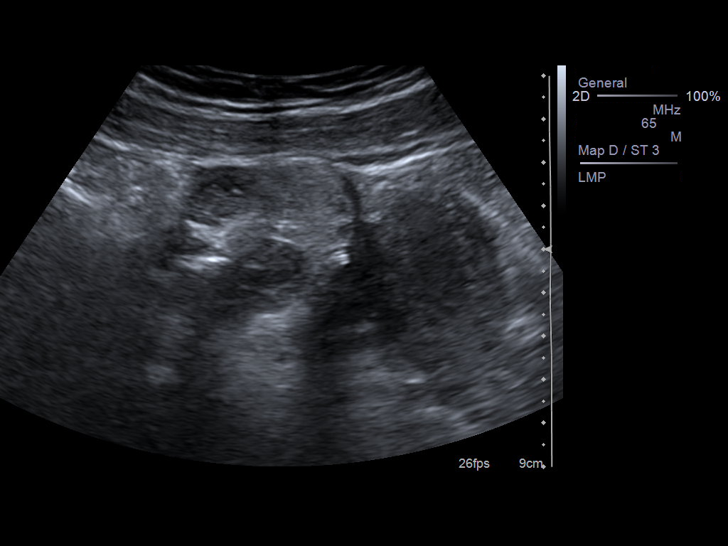
[im 28/82]
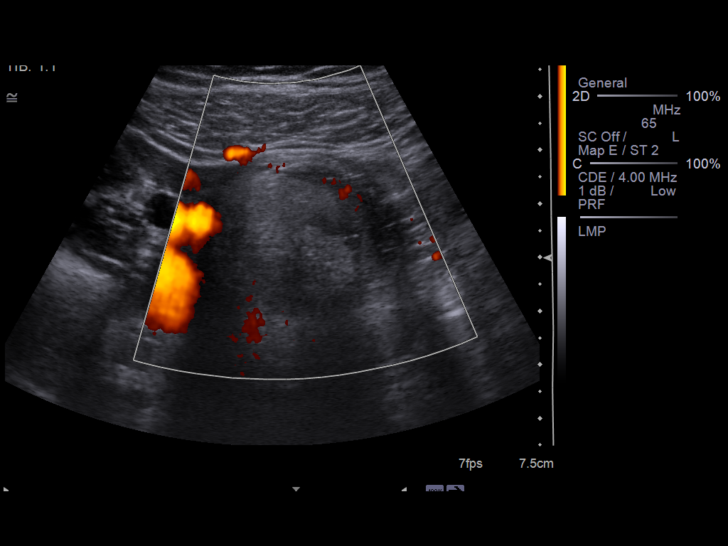
[im 34/82]
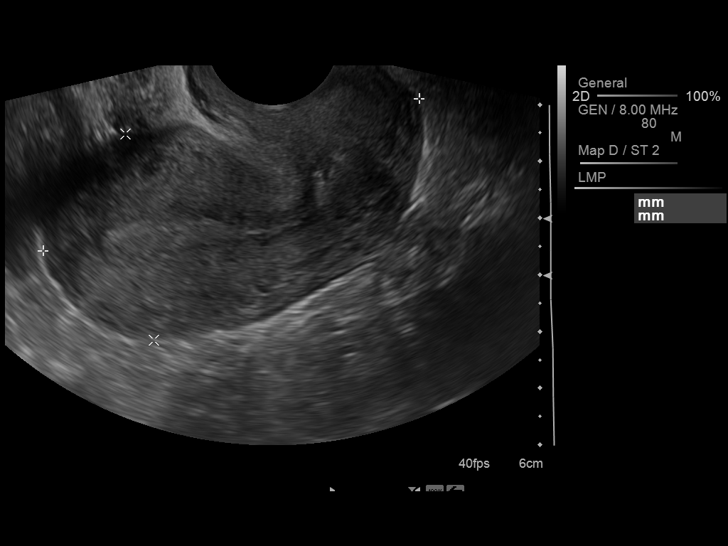
[im 41/82]
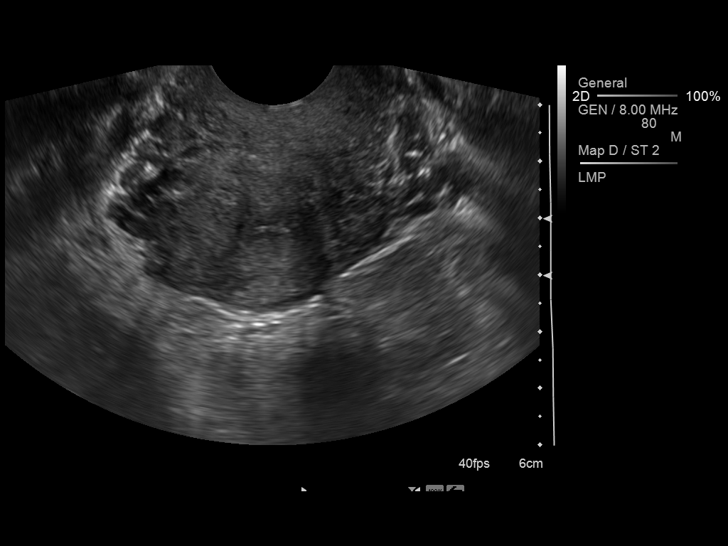
[im 48/82]
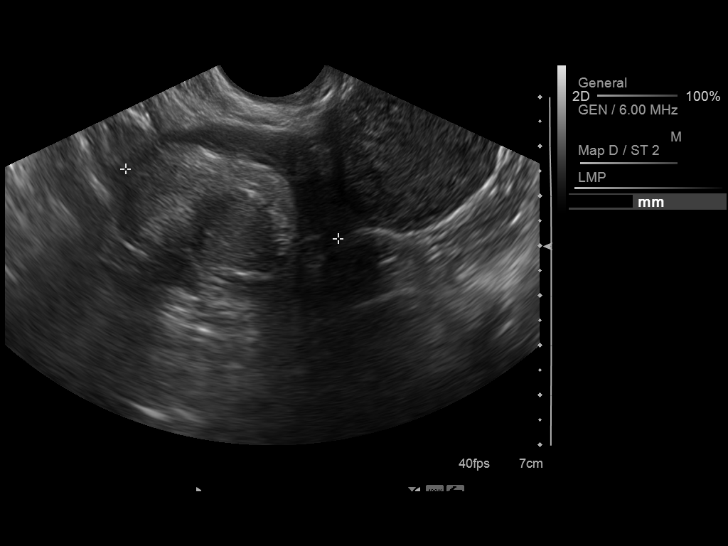
[im 55/82]
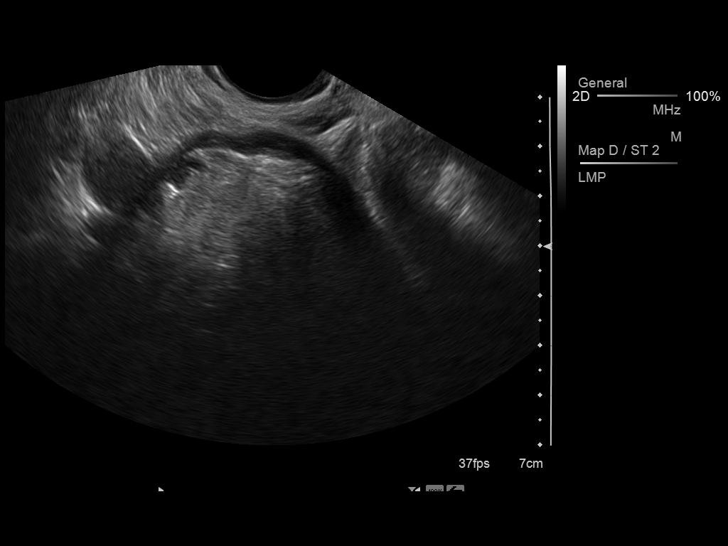
[im 61/82]
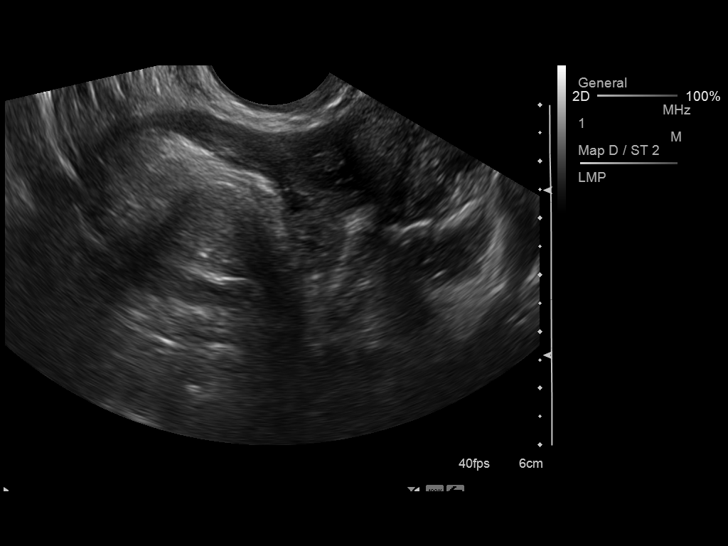
[im 68/82]
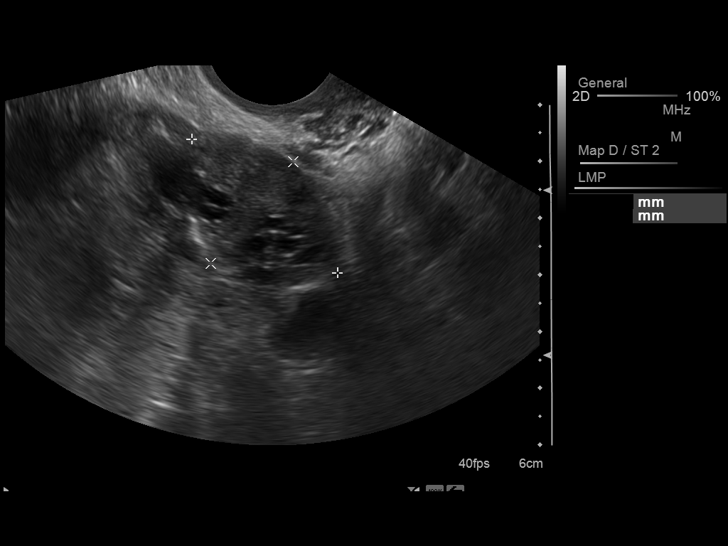
[im 75/82]
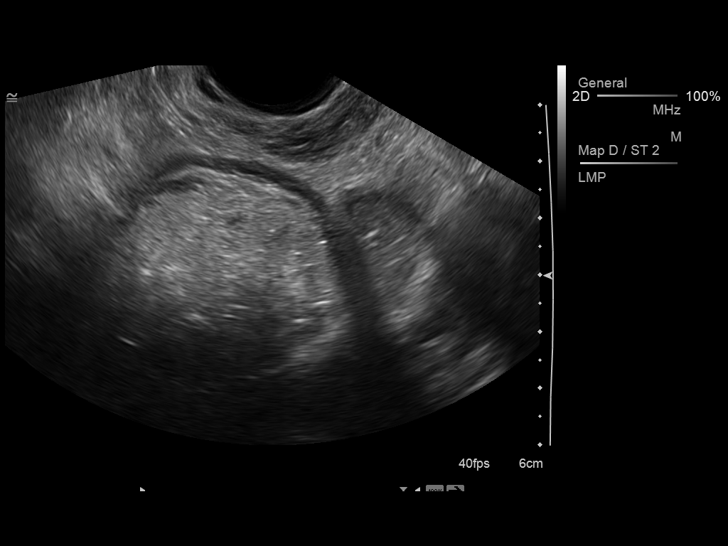
[im 82/82]
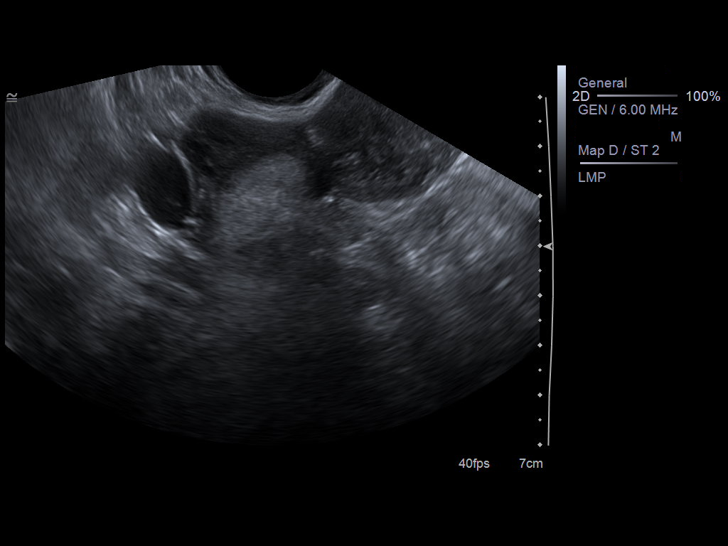

[13 of 25 positions shown; findings below may reference images not displayed]

FINDINGS: Uterus: The uterus is anteverted and measures 7.2 x 3.7 x 5.1 cm.
No focal myometrial masses.

Endometrium: Endometrial stripe thickness is normal at 6 mm.  No
abnormal endometrial fluid collections.

Right ovary:  The right ovary measures 5 x 4 x 4.8 cm. Flow is
demonstrated in the right ovary on color flow Doppler imaging.
There is a hyperechoic area in the right ovary measuring 3.8 x
x 2.8 cm.  This area does not demonstrate flow on color flow
Doppler imaging.  This is in the region of pain.  Findings most
likely represent a hemorrhagic cyst.

Left ovary: Left ovary measures 3.5 x 2.3 x 1.9 cm.  Normal
follicular changes.  Flow is demonstrated in the left ovary on
color flow Doppler imaging.

Other findings: No free pelvic fluid collections.
IMPRESSION: The right ovary contains a hyperechoic avascular structure likely
representing hemorrhagic cyst.  The uterus and left ovary are
normal in appearance..

## 2013-05-12 ENCOUNTER — Emergency Department (HOSPITAL_COMMUNITY)
Admission: EM | Admit: 2013-05-12 | Discharge: 2013-05-12 | Disposition: A | Discharge status: Home / Self Care | Payer: Medicaid Other | Attending: Emergency Medicine | Admitting: Emergency Medicine

## 2013-05-12 ENCOUNTER — Encounter (HOSPITAL_COMMUNITY): Payer: Self-pay | Admitting: Emergency Medicine

## 2013-05-12 DIAGNOSIS — Z8619 Personal history of other infectious and parasitic diseases: Secondary | ICD-10-CM | POA: Insufficient documentation

## 2013-05-12 DIAGNOSIS — N390 Urinary tract infection, site not specified: Secondary | ICD-10-CM

## 2013-05-12 DIAGNOSIS — Z3490 Encounter for supervision of normal pregnancy, unspecified, unspecified trimester: Secondary | ICD-10-CM

## 2013-05-12 DIAGNOSIS — O2390 Unspecified genitourinary tract infection in pregnancy, unspecified trimester: Secondary | ICD-10-CM | POA: Insufficient documentation

## 2013-05-12 DIAGNOSIS — D649 Anemia, unspecified: Secondary | ICD-10-CM | POA: Insufficient documentation

## 2013-05-12 DIAGNOSIS — R55 Syncope and collapse: Secondary | ICD-10-CM | POA: Insufficient documentation

## 2013-05-12 DIAGNOSIS — Z3201 Encounter for pregnancy test, result positive: Secondary | ICD-10-CM | POA: Insufficient documentation

## 2013-05-12 DIAGNOSIS — R109 Unspecified abdominal pain: Secondary | ICD-10-CM | POA: Insufficient documentation

## 2013-05-12 LAB — URINALYSIS, ROUTINE W REFLEX MICROSCOPIC
Glucose, UA: NEGATIVE mg/dL
Hgb urine dipstick: NEGATIVE
Specific Gravity, Urine: 1.028 (ref 1.005–1.030)
Urobilinogen, UA: 1 mg/dL (ref 0.0–1.0)

## 2013-05-12 LAB — URINE MICROSCOPIC-ADD ON

## 2013-05-12 LAB — POCT PREGNANCY, URINE: Preg Test, Ur: POSITIVE — AB

## 2013-05-12 MED ORDER — NITROFURANTOIN MONOHYD MACRO 100 MG PO CAPS: 100.0000 mg | ORAL_CAPSULE | Freq: Two times a day (BID) | ORAL | Status: DC

## 2013-05-12 NOTE — ED Provider Notes (Signed)
CSN: [id number]     Arrival date & time 05/12/13  0915 History     First MD Initiated Contact with Patient 05/12/13 [phone]     Chief Complaint  Patient presents with  . Dizziness   (Consider location/radiation/quality/duration/timing/severity/associated sxs/prior Treatment) HPI Patient presents with an episode of near-syncope. States she was at work and felt like she was going to pass out. She states she has also occasional lower abdominal cramping over the last couple weeks. Her last period was in April. She states it is not unusual for her cycle to be irregular. She states she is not on birth control now. She states her last time she felt like that she is pregnant. No vaginal bleeding or discharge. No chest pain. No nausea vomiting. No fevers. No dysuria.   Past Medical History  Diagnosis Date  . BV (bacterial vaginosis)     H/O  . H/O candidiasis   . Anemia    Past Surgical History  Procedure Laterality Date  . Wisdom tooth extraction  2011   Family History  Problem Relation Age of Onset  . Anesthesia problems Neg Hx   . Hypotension Neg Hx   . Malignant hyperthermia Neg Hx   . Pseudochol deficiency Neg Hx   . Cancer Mother   . Cancer Father   . Heart disease Sister   . Hypertension Maternal Grandmother   . Diabetes Maternal Grandmother   . Hypertension Maternal Grandfather   . Diabetes Maternal Grandfather   . Diabetes Paternal Grandmother   . Hypertension Paternal Grandmother   . Heart disease Paternal Grandfather   . Hypertension Paternal Grandfather   . Diabetes Maternal Uncle    History  Substance Use Topics  . Smoking status: Never Smoker   . Smokeless tobacco: Never Used  . Alcohol Use: No   OB History   Grav Para Term Preterm Abortions TAB SAB Ect Mult Living   1 1 1  0 0 0 0 0 0 1     Review of Systems  Constitutional: Negative for activity change and appetite change.  HENT: Negative for neck stiffness.   Eyes: Negative for pain.  Respiratory:  Negative for chest tightness and shortness of breath.   Cardiovascular: Negative for chest pain and leg swelling.  Gastrointestinal: Positive for abdominal pain. Negative for nausea, vomiting and diarrhea.  Genitourinary: Negative for flank pain, vaginal bleeding, vaginal discharge and vaginal pain.  Musculoskeletal: Negative for back pain.  Skin: Negative for rash.  Neurological: Positive for light-headedness. Negative for weakness, numbness and headaches.  Psychiatric/Behavioral: Negative for behavioral problems.    Allergies  Review of patient's allergies indicates no known allergies.  Home Medications   Current Outpatient Rx  Name  Route  Sig  Dispense  Refill  . acetaminophen (TYLENOL) 325 MG tablet   Oral   Take 650 mg by mouth every 6 (six) hours as needed. For pain         . nitrofurantoin, macrocrystal-monohydrate, (MACROBID) 100 MG capsule   Oral   Take 1 capsule (100 mg total) by mouth 2 (two) times daily.   10 capsule   0    BP 98/63  Pulse 86  Temp(Src) 98.3 F (36.8 C) (Oral)  Resp 18  SpO2 100%  LMP 01/10/2013 Physical Exam  Constitutional: She appears well-developed.  HENT:  Head: Normocephalic.  Eyes: Pupils are equal, round, and reactive to light.  Cardiovascular: Normal rate and regular rhythm.   Pulmonary/Chest: Effort normal.  Abdominal: Soft. She exhibits mass.  Pelvic mass to suprapubic area.  Musculoskeletal: Normal range of motion.  Neurological: She is alert.  Skin: Skin is warm.    ED Course   Procedures (including critical care time)  Labs Reviewed  URINALYSIS, ROUTINE W REFLEX MICROSCOPIC - Abnormal; Notable for the following:    APPearance TURBID (*)    Ketones, ur 15 (*)    Leukocytes, UA LARGE (*)    All other components within normal limits  URINE MICROSCOPIC-ADD ON - Abnormal; Notable for the following:    Squamous Epithelial / LPF MANY (*)    Bacteria, UA MANY (*)    Crystals CA OXALATE CRYSTALS (*)    All other  components within normal limits  POCT PREGNANCY, URINE - Abnormal; Notable for the following:    Preg Test, Ur POSITIVE (*)    All other components within normal limits  URINE CULTURE   No results found. 1. Pregnant   2. UTI (urinary tract infection)     MDM  Patient would near-syncope. Found to be pregnant. Doubt ectopic pregnancy at this time. We'll treat for urinary tract infection. Doubt need of IV fluid at this time. Has an obstetrician she can follow up. Will be discharged home  Juliet Rude. Rubin Payor, MD 05/12/13 1345

## 2013-05-12 NOTE — ED Notes (Signed)
Wrong documentation at [phone] wrong pt.

## 2013-05-12 NOTE — ED Notes (Signed)
Pt. 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

## 2013-05-12 NOTE — ED Notes (Signed)
Pt. Stated, I was at work and I about to pass out I felt dizzy.  And I felt my stomach being tight.  Last time i did this i was pregnant.

## 2013-05-13 LAB — URINE CULTURE

## 2013-06-09 LAB — OB RESULTS CONSOLE GC/CHLAMYDIA
Chlamydia: POSITIVE
Gonorrhea: NEGATIVE

## 2013-06-09 LAB — OB RESULTS CONSOLE ABO/RH: RH TYPE: POSITIVE

## 2013-06-09 LAB — OB RESULTS CONSOLE RPR: RPR: NONREACTIVE

## 2013-06-09 LAB — OB RESULTS CONSOLE ANTIBODY SCREEN: ANTIBODY SCREEN: NEGATIVE

## 2013-06-09 LAB — OB RESULTS CONSOLE RUBELLA ANTIBODY, IGM: RUBELLA: IMMUNE

## 2013-06-09 LAB — OB RESULTS CONSOLE HEPATITIS B SURFACE ANTIGEN: HEP B S AG: NEGATIVE

## 2013-06-09 LAB — OB RESULTS CONSOLE HIV ANTIBODY (ROUTINE TESTING): HIV: NONREACTIVE

## 2013-07-21 ENCOUNTER — Inpatient Hospital Stay (HOSPITAL_COMMUNITY)
Admission: AD | Admit: 2013-07-21 | Discharge: 2013-07-21 | Disposition: A | Discharge status: Home / Self Care | Payer: Medicaid Other | Source: Ambulatory Visit | Attending: Obstetrics and Gynecology | Admitting: Obstetrics and Gynecology

## 2013-07-21 ENCOUNTER — Encounter (HOSPITAL_COMMUNITY): Payer: Self-pay | Admitting: General Practice

## 2013-07-21 DIAGNOSIS — R0602 Shortness of breath: Secondary | ICD-10-CM | POA: Insufficient documentation

## 2013-07-21 DIAGNOSIS — R079 Chest pain, unspecified: Secondary | ICD-10-CM | POA: Insufficient documentation

## 2013-07-21 DIAGNOSIS — K30 Functional dyspepsia: Secondary | ICD-10-CM

## 2013-07-21 DIAGNOSIS — O99891 Other specified diseases and conditions complicating pregnancy: Secondary | ICD-10-CM | POA: Insufficient documentation

## 2013-07-21 MED ORDER — GI COCKTAIL ~~LOC~~
30.0000 mL | Freq: Once | ORAL | Status: AC
  Administered 2013-07-21: 30 mL via ORAL
  Filled 2013-07-21: qty 30

## 2013-07-21 NOTE — MAU Note (Signed)
O2 SAT   100%  IN TRIAGE.

## 2013-07-21 NOTE — MAU Note (Signed)
Pt in for c/o sob and chest pain. Denies anxiety. States feeling sob after walking

## 2013-07-21 NOTE — MAU Provider Note (Signed)
History   22 yo, G2P1001 at [redacted]w[redacted]d presents to MAU for SOB and chest pain starting this evening.  Denies N/V though was also feeling some "gassiness"  Denies VB, UCs, LOF, recent fever, GI c/o's, UTI or PIH s/s. GFM.  Chief Complaint  Patient presents with  . Chest Pain   The history is provided by the patient.    OB History   Grav Para Term Preterm Abortions TAB SAB Ect Mult Living   2 1 1  0 0 0 0 0 0 1      Past Medical History  Diagnosis Date  . BV (bacterial vaginosis)     H/O  . H/O candidiasis   . Anemia     Past Surgical History  Procedure Laterality Date  . Wisdom tooth extraction  2011    Family History  Problem Relation Age of Onset  . Anesthesia problems Neg Hx   . Hypotension Neg Hx   . Malignant hyperthermia Neg Hx   . Pseudochol deficiency Neg Hx   . Cancer Mother   . Cancer Father   . Heart disease Sister   . Hypertension Maternal Grandmother   . Diabetes Maternal Grandmother   . Hypertension Maternal Grandfather   . Diabetes Maternal Grandfather   . Diabetes Paternal Grandmother   . Hypertension Paternal Grandmother   . Heart disease Paternal Grandfather   . Hypertension Paternal Grandfather   . Diabetes Maternal Uncle     History  Substance Use Topics  . Smoking status: Never Smoker   . Smokeless tobacco: Never Used  . Alcohol Use: No    Allergies: No Known Allergies  Prescriptions prior to admission  Medication Sig Dispense Refill  . butalbital-acetaminophen-caffeine (FIORICET, ESGIC) 50-325-40 MG per tablet Take 1-2 tablets by mouth 2 (two) times daily as needed for pain or headache (For back pain.).      Marland Kitchen metroNIDAZOLE (FLAGYL) 500 MG tablet Take 500 mg by mouth 2 (two) times daily.      . Prenatal Vit-Fe Fumarate-FA (PRENATAL MULTIVITAMIN) TABS tablet Take 1 tablet by mouth daily at 12 noon.        Review of Systems  Constitutional: Negative.   HENT: Negative.   Respiratory: Positive for shortness of breath.   Cardiovascular:  Positive for chest pain.  Gastrointestinal: Positive for heartburn.  Genitourinary: Negative.    Physical Exam   Blood pressure 113/66, pulse 98, temperature 98.4 F (36.9 C), temperature source Oral, resp. rate 20, height 5\' 5"  (1.651 m), weight 167 lb (75.751 kg), last menstrual period 01/10/2013, SpO2 100.00%.  Physical Exam  Constitutional: She is oriented to person, place, and time. She appears well-developed and well-nourished.  HENT:  Head: Normocephalic.  Neck: Normal range of motion.  Cardiovascular: Normal rate, regular rhythm and normal heart sounds.   Respiratory: Effort normal and breath sounds normal.  GI: Soft.  Musculoskeletal: Normal range of motion.  Neurological: She is alert and oriented to person, place, and time.  Psychiatric: She has a normal mood and affect.    ED Course  IUP at [redacted]w[redacted]d SOB and chest pains at home Resolved while in MAU  EKG - NSR SpO2 - 100%  Encouraged to take OTC heartburn medications daily and Tums as needed Discussed using Simethicone for gas as needed  D/c home with precautions Encouraged to call if sx recur  F/u ROB in office is 11/7   Haroldine Laws CNM, MSN 07/21/2013 11:34 PM

## 2013-09-03 LAB — OB RESULTS CONSOLE GC/CHLAMYDIA
Chlamydia: NEGATIVE
Gonorrhea: NEGATIVE

## 2013-09-03 LAB — OB RESULTS CONSOLE GBS: GBS: NEGATIVE

## 2013-09-09 ENCOUNTER — Encounter (HOSPITAL_COMMUNITY): Payer: Self-pay

## 2013-09-09 ENCOUNTER — Inpatient Hospital Stay (HOSPITAL_COMMUNITY)
Admission: AD | Admit: 2013-09-09 | Discharge: 2013-09-09 | Disposition: A | Discharge status: Home / Self Care | Payer: Medicaid Other | Source: Ambulatory Visit | Attending: Obstetrics and Gynecology | Admitting: Obstetrics and Gynecology

## 2013-09-09 DIAGNOSIS — O4700 False labor before 37 completed weeks of gestation, unspecified trimester: Secondary | ICD-10-CM | POA: Insufficient documentation

## 2013-09-09 DIAGNOSIS — R109 Unspecified abdominal pain: Secondary | ICD-10-CM | POA: Insufficient documentation

## 2013-09-09 LAB — URINALYSIS, ROUTINE W REFLEX MICROSCOPIC
Hgb urine dipstick: NEGATIVE
Ketones, ur: 15 mg/dL — AB
Leukocytes, UA: NEGATIVE
Nitrite: NEGATIVE
Specific Gravity, Urine: 1.01 (ref 1.005–1.030)
Urobilinogen, UA: 0.2 mg/dL (ref 0.0–1.0)

## 2013-09-09 MED ORDER — OXYCODONE-ACETAMINOPHEN 5-325 MG PO TABS: 1.0000 | ORAL_TABLET | ORAL | Status: DC | PRN

## 2013-09-09 MED ORDER — ZOLPIDEM TARTRATE 10 MG PO TABS: 10.0000 mg | ORAL_TABLET | Freq: Every evening | ORAL | Status: AC | PRN

## 2013-09-09 NOTE — MAU Provider Note (Signed)
  History     CSN: [id number]  Arrival date and time: 09/09/13 1823   None     Chief Complaint  Patient presents with  . Abdominal Cramping   HPI Comments: Pt is G2P1 at [redacted]w[redacted]d arrives after being seen at office w c/o ctx. States still having some ctx but not as close together or as painful. Denies any VB or LOF. GFM.       Past Medical History  Diagnosis Date  . BV (bacterial vaginosis)     H/O  . H/O candidiasis   . Anemia     Past Surgical History  Procedure Laterality Date  . Wisdom tooth extraction  2011    Family History  Problem Relation Age of Onset  . Anesthesia problems Neg Hx   . Hypotension Neg Hx   . Malignant hyperthermia Neg Hx   . Pseudochol deficiency Neg Hx   . Heart disease Sister   . Hypertension Maternal Grandmother   . Diabetes Maternal Grandmother   . Cancer Maternal Grandmother   . Hypertension Maternal Grandfather   . Diabetes Maternal Grandfather   . Cancer Maternal Grandfather   . Diabetes Paternal Grandmother   . Hypertension Paternal Grandmother   . Heart disease Paternal Grandfather   . Hypertension Paternal Grandfather   . Diabetes Maternal Uncle     History  Substance Use Topics  . Smoking status: Never Smoker   . Smokeless tobacco: Never Used  . Alcohol Use: No    Allergies: No Known Allergies  Prescriptions prior to admission  Medication Sig Dispense Refill  . acetaminophen (TYLENOL) 325 MG tablet Take 650 mg by mouth every 6 (six) hours as needed.      . butalbital-acetaminophen-caffeine (FIORICET, ESGIC) 50-325-40 MG per tablet Take 1-2 tablets by mouth 2 (two) times daily as needed for pain or headache (For back pain.).      Marland Kitchen Prenatal Vit-Fe Fumarate-FA (PRENATAL MULTIVITAMIN) TABS tablet Take 1 tablet by mouth daily at 12 noon.        Review of Systems  All other systems reviewed and are negative.   Physical Exam   Blood pressure 108/59, pulse 99, temperature 98.5 F (36.9 C), temperature source Oral,  resp. rate 18, last menstrual period 01/10/2013.  Physical Exam  Nursing note and vitals reviewed. Constitutional: She is oriented to person, place, and time. She appears well-developed and well-nourished.  HENT:  Head: Normocephalic.  Eyes: Pupils are equal, round, and reactive to light.  Neck: Normal range of motion.  Cardiovascular: Normal rate and regular rhythm.   Respiratory: Effort normal.  GI: Soft.  Genitourinary: Vagina normal.  Cervix 1/th/high   Musculoskeletal: Normal range of motion.  Neurological: She is alert and oriented to person, place, and time. She has normal reflexes.  Skin: Skin is warm and dry.  Psychiatric: She has a normal mood and affect. Her behavior is normal.    MAU Course  Procedures    Assessment and Plan  IUP at [redacted]w[redacted]d Labor eval, cervix 1c/th/high, no change from office FHR cat 1 toco very rare ctx, some irritability  Dc home stable condtion rv'd FKC and preterm labor/false labor Keep f/u visit next week.  rx ambien 10mg  #15 0RF rx percocet 5/235mg  #15 0RF    Ajax Schroll M 09/09/2013, 9:17 PM

## 2013-09-09 NOTE — MAU Note (Signed)
Was at office at 3:30 pm, was monitored, was having ctxs then some cramping and then was told to come to MAU to be seen and monitored.

## 2013-10-02 NOTE — L&D Delivery Note (Signed)
Delivery Note Pt progressed quickly.  SVE at 6:50 PM 10/100/+3.  At 6:56 PM a viable female was delivered via Vaginal, Spontaneous Delivery (Presentation: Left Occiput Anterior). No nuchal cord.  No difficulty with shoulders.  Infant with spontaneous cry.  Infant dried and placed skin to skin with mother.  Cord doubly clamped after cessation of pulsation.  Cord cut by FOB.   APGAR: 8, 9; weight: 6# 13oz Placenta status: Intact, Spontaneous.  Cord: 3 vessels with the following complications: None.  Cord pH: N/A  Anesthesia: Epidural  Episiotomy: None Lacerations: None Suture Repair: N/A Est. Blood Loss (mL): 300  Mom to postpartum.  Baby to Couplet care / Skin to Skin.  Felton O. 10/18/2013, 7:17 PM

## 2013-10-15 ENCOUNTER — Inpatient Hospital Stay (HOSPITAL_COMMUNITY)
Admission: AD | Admit: 2013-10-15 | Discharge: 2013-10-15 | Disposition: A | Discharge status: Home / Self Care | Payer: Medicaid Other | Source: Ambulatory Visit | Attending: Obstetrics and Gynecology | Admitting: Obstetrics and Gynecology

## 2013-10-15 ENCOUNTER — Encounter (HOSPITAL_COMMUNITY): Payer: Self-pay

## 2013-10-15 DIAGNOSIS — O368190 Decreased fetal movements, unspecified trimester, not applicable or unspecified: Secondary | ICD-10-CM | POA: Insufficient documentation

## 2013-10-15 DIAGNOSIS — R109 Unspecified abdominal pain: Secondary | ICD-10-CM | POA: Insufficient documentation

## 2013-10-15 LAB — URINE MICROSCOPIC-ADD ON

## 2013-10-15 LAB — URINALYSIS, ROUTINE W REFLEX MICROSCOPIC
BILIRUBIN URINE: NEGATIVE
GLUCOSE, UA: NEGATIVE mg/dL
KETONES UR: NEGATIVE mg/dL
Nitrite: NEGATIVE
Protein, ur: NEGATIVE mg/dL
SPECIFIC GRAVITY, URINE: 1.01 (ref 1.005–1.030)
Urobilinogen, UA: 0.2 mg/dL (ref 0.0–1.0)
pH: 6 (ref 5.0–8.0)

## 2013-10-15 MED ORDER — PROMETHAZINE HCL 50 MG PO TABS: 25.0000 mg | ORAL_TABLET | Freq: Four times a day (QID) | ORAL | Status: DC | PRN

## 2013-10-15 MED ORDER — OXYCODONE-ACETAMINOPHEN 5-325 MG PO TABS: 1.0000 | ORAL_TABLET | ORAL | Status: DC | PRN

## 2013-10-15 NOTE — Discharge Instructions (Signed)
Fetal Movement Counts Patient Name: __________________________________________________ Patient Due Date: ____________________ Performing a fetal movement count is highly recommended in high-risk pregnancies, but it is good for every pregnant woman to do. Your caregiver may ask you to start counting fetal movements at 28 weeks of the pregnancy. Fetal movements often increase:  After eating a full meal.  After physical activity.  After eating or drinking something sweet or cold.  At rest. Pay attention to when you feel the baby is most active. This will help you notice a pattern of your baby's sleep and wake cycles and what factors contribute to an increase in fetal movement. It is important to perform a fetal movement count at the same time each day when your baby is normally most active.  HOW TO COUNT FETAL MOVEMENTS 1. Find a quiet and comfortable area to sit or lie down on your left side. Lying on your left side provides the best blood and oxygen circulation to your baby. 2. Write down the day and time on a sheet of paper or in a journal. 3. Start counting kicks, flutters, swishes, rolls, or jabs in a 2 hour period. You should feel at least 10 movements within 2 hours. 4. If you do not feel 10 movements in 2 hours, wait 2 3 hours and count again. Look for a change in the pattern or not enough counts in 2 hours. SEEK MEDICAL CARE IF:  You feel less than 10 counts in 2 hours, tried twice.  There is no movement in over an hour.  The pattern is changing or taking longer each day to reach 10 counts in 2 hours.  You feel the baby is not moving as he or she usually does. Date: ____________ Movements: ____________ Start time: ____________ Stacy Cruz time: ____________  Date: ____________ Movements: ____________ Start time: ____________ Stacy Cruz time: ____________ Date: ____________ Movements: ____________ Start time: ____________ Stacy Cruz time: ____________ Date: ____________ Movements: ____________  Start time: ____________ Stacy Cruz time: ____________ Date: ____________ Movements: ____________ Start time: ____________ Stacy Cruz time: ____________ Date: ____________ Movements: ____________ Start time: ____________ Stacy Cruz time: ____________ Date: ____________ Movements: ____________ Start time: ____________ Stacy Cruz time: ____________ Date: ____________ Movements: ____________ Start time: ____________ Stacy Cruz time: ____________  Date: ____________ Movements: ____________ Start time: ____________ Stacy Cruz time: ____________ Date: ____________ Movements: ____________ Start time: ____________ Stacy Cruz time: ____________ Date: ____________ Movements: ____________ Start time: ____________ Stacy Cruz time: ____________ Date: ____________ Movements: ____________ Start time: ____________ Stacy Cruz time: ____________ Date: ____________ Movements: ____________ Start time: ____________ Stacy Cruz time: ____________ Date: ____________ Movements: ____________ Start time: ____________ Stacy Cruz time: ____________ Date: ____________ Movements: ____________ Start time: ____________ Stacy Cruz time: ____________  Date: ____________ Movements: ____________ Start time: ____________ Stacy Cruz time: ____________ Date: ____________ Movements: ____________ Start time: ____________ Stacy Cruz time: ____________ Date: ____________ Movements: ____________ Start time: ____________ Stacy Cruz time: ____________ Date: ____________ Movements: ____________ Start time: ____________ Stacy Cruz time: ____________ Date: ____________ Movements: ____________ Start time: ____________ Stacy Cruz time: ____________ Date: ____________ Movements: ____________ Start time: ____________ Stacy Cruz time: ____________ Date: ____________ Movements: ____________ Start time: ____________ Stacy Cruz time: ____________  Date: ____________ Movements: ____________ Start time: ____________ Stacy Cruz time: ____________ Date: ____________ Movements: ____________ Start time: ____________ Stacy Cruz time:  ____________ Date: ____________ Movements: ____________ Start time: ____________ Stacy Cruz time: ____________ Date: ____________ Movements: ____________ Start time: ____________ Stacy Cruz time: ____________ Date: ____________ Movements: ____________ Start time: ____________ Stacy Cruz time: ____________ Date: ____________ Movements: ____________ Start time: ____________ Stacy Cruz time: ____________ Date: ____________ Movements: ____________ Start time: ____________ Stacy Cruz time: ____________  Date: ____________ Movements: ____________ Start time: ____________ Stacy Cruz  time: ____________ Date: ____________ Movements: ____________ Start time: ____________ Stacy Cruz time: ____________ Date: ____________ Movements: ____________ Start time: ____________ Stacy Cruz time: ____________ Date: ____________ Movements: ____________ Start time: ____________ Stacy Cruz time: ____________ Date: ____________ Movements: ____________ Start time: ____________ Stacy Cruz time: ____________ Date: ____________ Movements: ____________ Start time: ____________ Stacy Cruz time: ____________ Date: ____________ Movements: ____________ Start time: ____________ Stacy Cruz time: ____________  Date: ____________ Movements: ____________ Start time: ____________ Stacy Cruz time: ____________ Date: ____________ Movements: ____________ Start time: ____________ Stacy Cruz time: ____________ Date: ____________ Movements: ____________ Start time: ____________ Stacy Cruz time: ____________ Date: ____________ Movements: ____________ Start time: ____________ Stacy Cruz time: ____________ Date: ____________ Movements: ____________ Start time: ____________ Stacy Cruz time: ____________ Date: ____________ Movements: ____________ Start time: ____________ Stacy Cruz time: ____________ Date: ____________ Movements: ____________ Start time: ____________ Stacy Cruz time: ____________  Date: ____________ Movements: ____________ Start time: ____________ Stacy Cruz time: ____________ Date: ____________ Movements:  ____________ Start time: ____________ Stacy Cruz time: ____________ Date: ____________ Movements: ____________ Start time: ____________ Stacy Cruz time: ____________ Date: ____________ Movements: ____________ Start time: ____________ Stacy Cruz time: ____________ Date: ____________ Movements: ____________ Start time: ____________ Stacy Cruz time: ____________ Date: ____________ Movements: ____________ Start time: ____________ Stacy Cruz time: ____________ Date: ____________ Movements: ____________ Start time: ____________ Stacy Cruz time: ____________  Date: ____________ Movements: ____________ Start time: ____________ Stacy Cruz time: ____________ Date: ____________ Movements: ____________ Start time: ____________ Stacy Cruz time: ____________ Date: ____________ Movements: ____________ Start time: ____________ Stacy Cruz time: ____________ Date: ____________ Movements: ____________ Start time: ____________ Stacy Cruz time: ____________ Date: ____________ Movements: ____________ Start time: ____________ Stacy Cruz time: ____________ Date: ____________ Movements: ____________ Start time: ____________ Stacy Cruz time: ____________ Document Released: 10/18/2006 Document Revised: 09/04/2012 Document Reviewed: 07/15/2012 Exit[employer] Patient Information 2014 [place].  Braxton Hicks Contractions Pregnancy is commonly associated with contractions of the uterus throughout the pregnancy. Towards the end of pregnancy (32 to 34 weeks), these contractions [facility] Ishmael Holter) can develop more often and may become more forceful. This is not true labor because these contractions do not result in opening (dilatation) and thinning of the cervix. They are sometimes difficult to tell apart from true labor because these contractions can be forceful and people have different pain tolerances. You should not feel embarrassed if you go to the hospital with false labor. Sometimes, the only way to tell if you are in true labor is for your caregiver to follow the changes in  the cervix. How to tell the difference between true and false labor:  False labor.  The contractions of false labor are usually shorter, irregular and not as hard as those of true labor.  They are often felt in the front of the lower abdomen and in the groin.  They may leave with walking around or changing positions while lying down.  They get weaker and are shorter lasting as time goes on.  These contractions are usually irregular.  They do not usually become progressively stronger, regular and closer together as with true labor.  True labor.  Contractions in true labor last 30 to 70 seconds, become very regular, usually become more intense, and increase in frequency.  They do not go away with walking.  The discomfort is usually felt in the top of the uterus and spreads to the lower abdomen and low back.  True labor can be determined by your caregiver with an exam. This will show that the cervix is dilating and getting thinner. If there are no prenatal problems or other health problems associated with the pregnancy, it is completely safe to be sent home with false labor and await the onset of true  true labor. °HOME CARE INSTRUCTIONS  °· Keep up with your usual exercises and instructions. °· Take medications as directed. °· Keep your regular prenatal appointment. °· Eat and drink lightly if you think you are going into labor. °· If BH contractions are making you uncomfortable: °· Change your activity position from lying down or resting to walking/walking to resting. °· Sit and rest in a tub of warm water. °· Drink 2 to 3 glasses of water. Dehydration may cause B-H contractions. °· Do slow and deep breathing several times an hour. °SEEK IMMEDIATE MEDICAL CARE IF:  °· Your contractions continue to become stronger, more regular, and closer together. °· You have a gushing, burst or leaking of fluid from the vagina. °· An oral temperature above 102° F (38.9° C) develops. °· You have passage of  blood-tinged mucus. °· You develop vaginal bleeding. °· You develop continuous belly (abdominal) pain. °· You have low back pain that you never had before. °· You feel the baby's head pushing down causing pelvic pressure. °· The baby is not moving as much as it used to. °Document Released: 09/18/2005 Document Revised: 12/11/2011 Document Reviewed: 06/30/2013 °ExitCare® Patient Information ©2014 [employer]. ° ° °

## 2013-10-15 NOTE — MAU Note (Signed)
Pt c/o decreased fetal movement since this morning. Around 1730, pt c/o having lower abdominal cramping. Denies LOF or vag bleeding.

## 2013-10-18 ENCOUNTER — Encounter (HOSPITAL_COMMUNITY): Payer: Self-pay | Admitting: *Deleted

## 2013-10-18 ENCOUNTER — Inpatient Hospital Stay (HOSPITAL_COMMUNITY)
Admission: AD | Admit: 2013-10-18 | Discharge: 2013-10-20 | DRG: 775 | Disposition: A | Discharge status: Home / Self Care | Payer: Medicaid Other | Source: Ambulatory Visit | Attending: Obstetrics and Gynecology | Admitting: Obstetrics and Gynecology

## 2013-10-18 ENCOUNTER — Inpatient Hospital Stay (HOSPITAL_COMMUNITY): Payer: Medicaid Other | Admitting: Anesthesiology

## 2013-10-18 ENCOUNTER — Inpatient Hospital Stay (HOSPITAL_COMMUNITY)
Admission: RE | Admit: 2013-10-18 | Discharge: 2013-10-18 | Disposition: A | Discharge status: Home / Self Care | Payer: Medicaid Other | Source: Ambulatory Visit | Attending: Obstetrics and Gynecology | Admitting: Obstetrics and Gynecology

## 2013-10-18 ENCOUNTER — Encounter (HOSPITAL_COMMUNITY): Payer: Medicaid Other | Admitting: Anesthesiology

## 2013-10-18 DIAGNOSIS — O368190 Decreased fetal movements, unspecified trimester, not applicable or unspecified: Secondary | ICD-10-CM | POA: Diagnosis present

## 2013-10-18 DIAGNOSIS — Z3A41 41 weeks gestation of pregnancy: Secondary | ICD-10-CM

## 2013-10-18 DIAGNOSIS — O480 Post-term pregnancy: Secondary | ICD-10-CM | POA: Diagnosis present

## 2013-10-18 DIAGNOSIS — D649 Anemia, unspecified: Secondary | ICD-10-CM | POA: Diagnosis not present

## 2013-10-18 DIAGNOSIS — O9903 Anemia complicating the puerperium: Secondary | ICD-10-CM | POA: Diagnosis not present

## 2013-10-18 LAB — CBC
HEMATOCRIT: 34.1 % — AB (ref 36.0–46.0)
Hemoglobin: 10.8 g/dL — ABNORMAL LOW (ref 12.0–15.0)
MCH: 23.7 pg — AB (ref 26.0–34.0)
MCHC: 31.7 g/dL (ref 30.0–36.0)
MCV: 74.9 fL — ABNORMAL LOW (ref 78.0–100.0)
Platelets: 134 10*3/uL — ABNORMAL LOW (ref 150–400)
RBC: 4.55 MIL/uL (ref 3.87–5.11)
RDW: 15.8 % — ABNORMAL HIGH (ref 11.5–15.5)
WBC: 6.4 10*3/uL (ref 4.0–10.5)

## 2013-10-18 LAB — RPR: RPR: NONREACTIVE

## 2013-10-18 MED ORDER — LANOLIN HYDROUS EX OINT: TOPICAL_OINTMENT | CUTANEOUS | Status: DC | PRN

## 2013-10-18 MED ORDER — PRENATAL MULTIVITAMIN CH
1.0000 | ORAL_TABLET | Freq: Every day | ORAL | Status: DC
Start: 2013-10-19 — End: 2013-10-20
  Administered 2013-10-19: 1 via ORAL
  Filled 2013-10-18: qty 1

## 2013-10-18 MED ORDER — FENTANYL CITRATE 0.05 MG/ML IJ SOLN
100.0000 ug | INTRAMUSCULAR | Status: DC | PRN
Start: 2013-10-18 — End: 2013-10-18
  Administered 2013-10-18: 100 ug via INTRAVENOUS
  Filled 2013-10-18: qty 2

## 2013-10-18 MED ORDER — FENTANYL 2.5 MCG/ML BUPIVACAINE 1/10 % EPIDURAL INFUSION (WH - ANES)
14.0000 mL/h | INTRAMUSCULAR | Status: DC | PRN
  Administered 2013-10-18: 14 mL/h via EPIDURAL
  Filled 2013-10-18: qty 125

## 2013-10-18 MED ORDER — OXYCODONE-ACETAMINOPHEN 5-325 MG PO TABS: 1.0000 | ORAL_TABLET | ORAL | Status: DC | PRN

## 2013-10-18 MED ORDER — TERBUTALINE SULFATE 1 MG/ML IJ SOLN: 0.2500 mg | Freq: Once | INTRAMUSCULAR | Status: AC | PRN

## 2013-10-18 MED ORDER — EPHEDRINE 5 MG/ML INJ
10.0000 mg | INTRAVENOUS | Status: DC | PRN
  Filled 2013-10-18: qty 4
  Filled 2013-10-18: qty 2

## 2013-10-18 MED ORDER — TETANUS-DIPHTH-ACELL PERTUSSIS 5-2.5-18.5 LF-MCG/0.5 IM SUSP: 0.5000 mL | Freq: Once | INTRAMUSCULAR | Status: DC

## 2013-10-18 MED ORDER — PHENYLEPHRINE 40 MCG/ML (10ML) SYRINGE FOR IV PUSH (FOR BLOOD PRESSURE SUPPORT)
80.0000 ug | PREFILLED_SYRINGE | INTRAVENOUS | Status: DC | PRN
Start: 2013-10-18 — End: 2013-10-18
  Filled 2013-10-18: qty 10
  Filled 2013-10-18: qty 2

## 2013-10-18 MED ORDER — ACETAMINOPHEN 325 MG PO TABS: 650.0000 mg | ORAL_TABLET | ORAL | Status: DC | PRN

## 2013-10-18 MED ORDER — WITCH HAZEL-GLYCERIN EX PADS: 1.0000 "application " | MEDICATED_PAD | CUTANEOUS | Status: DC | PRN

## 2013-10-18 MED ORDER — SODIUM BICARBONATE 8.4 % IV SOLN
INTRAVENOUS | Status: DC | PRN
  Administered 2013-10-18: 5 mL via EPIDURAL

## 2013-10-18 MED ORDER — PHENYLEPHRINE 40 MCG/ML (10ML) SYRINGE FOR IV PUSH (FOR BLOOD PRESSURE SUPPORT)
80.0000 ug | PREFILLED_SYRINGE | INTRAVENOUS | Status: DC | PRN
  Filled 2013-10-18: qty 2

## 2013-10-18 MED ORDER — CITRIC ACID-SODIUM CITRATE 334-500 MG/5ML PO SOLN: 30.0000 mL | ORAL | Status: DC | PRN

## 2013-10-18 MED ORDER — LACTATED RINGERS IV SOLN: 500.0000 mL | Freq: Once | INTRAVENOUS | Status: DC

## 2013-10-18 MED ORDER — IBUPROFEN 600 MG PO TABS
600.0000 mg | ORAL_TABLET | Freq: Four times a day (QID) | ORAL | Status: DC | PRN
  Administered 2013-10-19: 600 mg via ORAL

## 2013-10-18 MED ORDER — OXYTOCIN 40 UNITS IN LACTATED RINGERS INFUSION - SIMPLE MED: 62.5000 mL/h | INTRAVENOUS | Status: DC

## 2013-10-18 MED ORDER — IBUPROFEN 600 MG PO TABS
600.0000 mg | ORAL_TABLET | Freq: Four times a day (QID) | ORAL | Status: DC
  Administered 2013-10-18 – 2013-10-20 (×5): 600 mg via ORAL
  Filled 2013-10-18 (×6): qty 1

## 2013-10-18 MED ORDER — EPHEDRINE 5 MG/ML INJ
10.0000 mg | INTRAVENOUS | Status: DC | PRN
  Filled 2013-10-18: qty 2

## 2013-10-18 MED ORDER — BENZOCAINE-MENTHOL 20-0.5 % EX AERO
1.0000 "application " | INHALATION_SPRAY | CUTANEOUS | Status: DC | PRN
  Administered 2013-10-18: 1 via TOPICAL
  Filled 2013-10-18: qty 56

## 2013-10-18 MED ORDER — LACTATED RINGERS IV SOLN: 500.0000 mL | INTRAVENOUS | Status: DC | PRN

## 2013-10-18 MED ORDER — SIMETHICONE 80 MG PO CHEW: 80.0000 mg | CHEWABLE_TABLET | ORAL | Status: DC | PRN

## 2013-10-18 MED ORDER — SENNOSIDES-DOCUSATE SODIUM 8.6-50 MG PO TABS
2.0000 | ORAL_TABLET | ORAL | Status: DC
  Administered 2013-10-18 – 2013-10-19 (×2): 2 via ORAL
  Filled 2013-10-18 (×2): qty 2

## 2013-10-18 MED ORDER — ONDANSETRON HCL 4 MG/2ML IJ SOLN: 4.0000 mg | Freq: Four times a day (QID) | INTRAMUSCULAR | Status: DC | PRN

## 2013-10-18 MED ORDER — DIPHENHYDRAMINE HCL 25 MG PO CAPS: 25.0000 mg | ORAL_CAPSULE | Freq: Four times a day (QID) | ORAL | Status: DC | PRN

## 2013-10-18 MED ORDER — OXYTOCIN BOLUS FROM INFUSION: 500.0000 mL | INTRAVENOUS | Status: DC

## 2013-10-18 MED ORDER — LIDOCAINE HCL (PF) 1 % IJ SOLN
30.0000 mL | INTRAMUSCULAR | Status: DC | PRN
  Filled 2013-10-18 (×2): qty 30

## 2013-10-18 MED ORDER — LACTATED RINGERS IV SOLN
INTRAVENOUS | Status: DC
  Administered 2013-10-18 (×3): via INTRAVENOUS

## 2013-10-18 MED ORDER — ONDANSETRON HCL 4 MG/2ML IJ SOLN: 4.0000 mg | INTRAMUSCULAR | Status: DC | PRN

## 2013-10-18 MED ORDER — ONDANSETRON HCL 4 MG PO TABS: 4.0000 mg | ORAL_TABLET | ORAL | Status: DC | PRN

## 2013-10-18 MED ORDER — ZOLPIDEM TARTRATE 5 MG PO TABS: 5.0000 mg | ORAL_TABLET | Freq: Every evening | ORAL | Status: DC | PRN

## 2013-10-18 MED ORDER — DIBUCAINE 1 % RE OINT: 1.0000 "application " | TOPICAL_OINTMENT | RECTAL | Status: DC | PRN

## 2013-10-18 MED ORDER — OXYTOCIN 40 UNITS IN LACTATED RINGERS INFUSION - SIMPLE MED
1.0000 m[IU]/min | INTRAVENOUS | Status: DC
  Administered 2013-10-18: 2 m[IU]/min via INTRAVENOUS
  Filled 2013-10-18: qty 1000

## 2013-10-18 MED ORDER — DIPHENHYDRAMINE HCL 50 MG/ML IJ SOLN: 12.5000 mg | INTRAMUSCULAR | Status: DC | PRN

## 2013-10-18 MED ORDER — FLEET ENEMA 7-19 GM/118ML RE ENEM: 1.0000 | ENEMA | RECTAL | Status: DC | PRN

## 2013-10-18 MED ORDER — OXYCODONE-ACETAMINOPHEN 5-325 MG PO TABS
1.0000 | ORAL_TABLET | ORAL | Status: DC | PRN
  Administered 2013-10-19 (×3): 1 via ORAL
  Filled 2013-10-18 (×3): qty 1

## 2013-10-18 NOTE — Anesthesia Procedure Notes (Signed)

## 2013-10-18 NOTE — Anesthesia Preprocedure Evaluation (Signed)

## 2013-10-18 NOTE — Progress Notes (Signed)
Stacy Cruz is a 23 y.o. G2P1001 at [redacted]w[redacted]d  Subjective: Pt now breathing with UCs and unable to walk or talk through.  Requests to discuss pain relief options.  Objective: BP 137/117  Pulse 87  Temp(Src) 98 F (36.7 C) (Oral)  Resp 18  Ht 5\' 4"  (1.626 m)  Wt 80.74 kg (178 lb)  BMI 30.54 kg/m2  LMP 01/10/2013   FHT:  FHR: 130 bpm, variability: moderate,  accelerations:  Present,  decelerations:  Present intermittent mild variables present UC:   regular, every 1.5-3 minutes.  Pitocin at 67mu/min SVE:   Dilation: 3.5 Effacement (%): 60 Station: -2 Exam by:: Autym Siess  Labs: Lab Results  Component Value Date   WBC 6.4 10/18/2013   HGB 10.8* 10/18/2013   HCT 34.1* 10/18/2013   MCV 74.9* 10/18/2013   PLT 134* 10/18/2013    Assessment / Plan: Induction of labor due to postterm.  Labor: Latent labor Preeclampsia:  no signs or symptoms of toxicity Fetal Wellbeing:  Category II Pain Control:  Fentanyl I/D:  GBS neg/Afebrile Anticipated MOD:  NSVD  RBA pain relief options d/w pt including IV pain meds and epidural.  Pt first desires to try fentanyl and then may want epidural.   Ayona Yniguez O. 10/18/2013, 4:25 PM

## 2013-10-18 NOTE — H&P (Signed)
Stacy Cruz is a 23 y.o. female presenting for induction of labor at [redacted]w[redacted]d. History Pt presents for induction of labor due to post dates and decreased fetal movement.    OB History   Grav Para Term Preterm Abortions TAB SAB Ect Mult Living   2 1 1  0 0 0 0 0 0 1     History of present pregnancy: Pt entered care at 22 weeks. EDC established by ultrasound at [redacted]w[redacted]d.  No genetic screening due to late presentation for care.  Dx and tx for chlamydia at [facility] with neg TOC.  Some preterm contractions 34wks which resolved spontaneously.  Repeat ultrasound at 37wks due to size less than dates with EFW at 68%ile.  Repeat US obtained at 38wks for presentation.  Pt with c/o decreased fetal movement at [redacted]w[redacted]d and evaluated by NST and BPP with reactive NST and BPP 6/8.  Scheduled for induction due to findings, favorable cervix and gestational age.    Past Medical History  Diagnosis Date  . BV (bacterial vaginosis)     H/O  . H/O candidiasis   . Anemia    Past Surgical History  Procedure Laterality Date  . Wisdom tooth extraction  2011   Family History: family history includes Cancer in her maternal grandfather and maternal grandmother; Diabetes in her maternal grandfather, maternal grandmother, maternal uncle, and paternal grandmother; Heart disease in her paternal grandfather and sister; Hypertension in her maternal grandfather, maternal grandmother, paternal grandfather, and paternal grandmother. There is no history of Anesthesia problems, Hypotension, Malignant hyperthermia, or Pseudochol deficiency. Social History:  reports that she has never smoked. She has never used smokeless tobacco. She reports that she does not drink alcohol or use illicit drugs.   Prenatal Transfer Tool  Maternal Diabetes: No Genetic Screening: Declined Maternal Ultrasounds/Referrals: Normal Fetal Ultrasounds or other Referrals:  None Maternal Substance Abuse:  No Significant Maternal Medications:  None Significant  Maternal Lab Results:  None Other Comments:  None  Review of Systems  Constitutional: Negative.   HENT: Negative.   Eyes: Negative.   Respiratory: Negative.   Cardiovascular: Negative.   Gastrointestinal: Negative.   Genitourinary: Negative.   Musculoskeletal: Negative.   Skin: Negative.   Neurological: Negative.   Endo/Heme/Allergies: Negative.   Psychiatric/Behavioral: Negative.     Dilation: 3 Effacement (%): 60 Station: -3 Exam by:: Kathaleya Mcduffee Blood pressure 115/63, pulse 83, temperature 98.1 F (36.7 C), temperature source Oral, resp. rate 16, height 5\' 4"  (1.626 m), weight 80.74 kg (178 lb), last menstrual period 01/10/2013. Maternal Exam:  Uterine Assessment: Contraction strength is mild.  Contraction frequency is rare.   Abdomen: Fundal height is 40.   Estimated fetal weight is 7.5#.   Fetal presentation: vertex  Introitus: Normal vulva. Ferning test: not done.  Nitrazine test: not done. Amniotic fluid character: not assessed.  Pelvis: adequate for delivery.   Cervix: Cervix evaluated by digital exam.     Fetal Exam Fetal Monitor Review: Mode: ultrasound.   Baseline rate: 125.  Variability: moderate (6-25 bpm).   Pattern: accelerations present and no decelerations.    Fetal State Assessment: Category I - tracings are normal.     Physical Exam  Nursing note and vitals reviewed. Constitutional: She is oriented to person, place, and time. She appears well-developed and well-nourished.  HENT:  Head: Normocephalic and atraumatic.  Right Ear: External ear normal.  Left Ear: External ear normal.  Nose: Nose normal.  Eyes: Conjunctivae are normal. Pupils are equal, round, and reactive to light.  Neck: Normal range of motion. Neck supple.  Cardiovascular: Normal rate, regular rhythm and intact distal pulses.   Respiratory: Effort normal and breath sounds normal.  GI: Soft. Bowel sounds are normal. There is no tenderness.  Gravid  Genitourinary: Vagina normal.   Musculoskeletal: Normal range of motion.  Neurological: She is alert and oriented to person, place, and time. She has normal reflexes.  Skin: Skin is warm and dry.  Psychiatric: She has a normal mood and affect. Her behavior is normal.    Prenatal labs: ABO, Rh: A/Positive/-- (09/08 0000) Antibody: Negative (09/08 0000) Rubella: Immune (09/08 0000) RPR: Nonreactive (09/08 0000)  HBsAg: Negative (09/08 0000)  HIV: Non-reactive (09/08 0000)  GBS: Negative (12/03 0000)   Assessment/Plan: IUP at [redacted]w[redacted]d Decreased fetal movement  Admitted to [facility] per consult with Dr. Charlton Amor induction of labor d/w pt, including increased risk of C/S.  Pt desires to proceed. Will begin Pitocin per protocol. Pt undecided regarding pain control during labor - RBA pain control measures, including IV pain medications as well as epidural d/w pt, Pt will request as she needs.     [place] O. 10/18/2013, 9:57 AM

## 2013-10-19 LAB — CBC
HEMATOCRIT: 31.6 % — AB (ref 36.0–46.0)
HEMOGLOBIN: 10.1 g/dL — AB (ref 12.0–15.0)
MCH: 23.8 pg — ABNORMAL LOW (ref 26.0–34.0)
MCHC: 32 g/dL (ref 30.0–36.0)
MCV: 74.5 fL — ABNORMAL LOW (ref 78.0–100.0)
Platelets: 138 10*3/uL — ABNORMAL LOW (ref 150–400)
RBC: 4.24 MIL/uL (ref 3.87–5.11)
RDW: 15.7 % — ABNORMAL HIGH (ref 11.5–15.5)
WBC: 9.3 10*3/uL (ref 4.0–10.5)

## 2013-10-19 MED ORDER — POLYSACCHARIDE IRON COMPLEX 150 MG PO CAPS
150.0000 mg | ORAL_CAPSULE | Freq: Every day | ORAL | Status: DC
  Administered 2013-10-19: 150 mg via ORAL
  Filled 2013-10-19 (×2): qty 1

## 2013-10-19 NOTE — Progress Notes (Signed)
Patient ID: Stacy Cruz, female   DOB: 1991/03/16, 23 y.o.   MRN: [id number] Post Partum Day 1  Subjective: no complaints, up ad lib without syncope, voiding, tolerating PO,  Pain well controlled with po meds,  BF well Mood stable, bonding well Contraception: IUD    Objective: Blood pressure 109/72, pulse 87, temperature 98.3 F (36.8 C), temperature source Oral, resp. rate 18, height 5\' 4"  (1.626 m), weight 178 lb (80.74 kg), last menstrual period 01/10/2013, SpO2 100.00%, unknown if currently breastfeeding.  Physical Exam:  General: NAD  Lochia: appropriate Uterine Fundus: firm Perineum: healing  DVT Evaluation: No evidence of DVT seen on physical exam. Negative Homan's sign. No significant calf/ankle edema.   Recent Labs  10/18/13 0810 10/19/13 0545  HGB 10.8* 10.1*  HCT 34.1* 31.6*    Assessment/Plan: Plan for discharge tomorrow, Breastfeeding and Contraception IUD Mild anemia, FE supplement       LOS: 1 day   Colleene Swarthout M 10/19/2013, 10:16 AM

## 2013-10-19 NOTE — Anesthesia Postprocedure Evaluation (Signed)
  Anesthesia Post-op Note  Patient: Stacy Cruz  Procedure(s) Performed: * No procedures listed *  Patient Location: PACU and Mother/Baby  Anesthesia Type:Epidural  Level of Consciousness: awake, alert  and oriented  Airway and Oxygen Therapy: Patient Spontanous Breathing  Post-op Pain: none  Post-op Assessment: Post-op Vital signs reviewed, Patient's Cardiovascular Status Stable, No headache, No backache, No residual numbness and No residual motor weakness  Post-op Vital Signs: Reviewed and stable  Complications: No apparent anesthesia complications

## 2013-10-20 MED ORDER — OXYCODONE-ACETAMINOPHEN 5-325 MG PO TABS: 1.0000 | ORAL_TABLET | ORAL | Status: DC | PRN

## 2013-10-20 MED ORDER — IBUPROFEN 600 MG PO TABS: 600.0000 mg | ORAL_TABLET | Freq: Four times a day (QID) | ORAL | Status: DC

## 2013-10-20 MED ORDER — POLYSACCHARIDE IRON COMPLEX 150 MG PO CAPS: 150.0000 mg | ORAL_CAPSULE | Freq: Every day | ORAL | Status: DC

## 2013-10-20 NOTE — Discharge Summary (Signed)
Vaginal Delivery Discharge Summary  Stacy Cruz  DOB:    09/30/1991 MRN:    [id number] CSN:    [id number]  Date of admission:                  10/18/13     Date of discharge:                   10/20/13  Procedures this admission: SVD  Date of Delivery: 10/18/13  Newborn Data:  Live born female  Birth Weight: 6 lb 13.4 oz (3100 g) APGAR: 8, 9  Home with mother.   History of Present Illness:  Ms. Stacy Cruz is a 23 y.o. female, G2P2002, who presents at [redacted]w[redacted]d weeks gestation. The patient has been followed at the [facility]cology division of [facility]. She was admitted onset of labor. Her pregnancy has been complicated by: none.  Hospital course:  The patient was admitted for .   Her labor was not complicated. She proceeded to have a vaginal delivery of a healthy infant. Her delivery was not complicated. Her postpartum course was not complicated.  She was discharged to home on postpartum day 2 doing well.  Feeding:  breast  Contraception:  IUD  Discharge hemoglobin:  Hemoglobin  Date Value Range Status  10/19/2013 10.1* 12.0 - 15.0 g/dL Final     HCT  Date Value Range Status  10/19/2013 31.6* 36.0 - 46.0 % Final    Discharge Physical Exam:   General: alert and no distress Lochia: appropriate Uterine Fundus: firm Incision: healing well DVT Evaluation: No evidence of DVT seen on physical exam. Negative Homan's sign. No significant calf/ankle edema.  Intrapartum Procedures: spontaneous vaginal delivery Postpartum Procedures: none Complications-Operative and Postpartum: none  Discharge Diagnoses: Term Pregnancy-delivered  Discharge Information:  Activity:           pelvic rest Diet:                routine Medications: Ibuprofen and Percocet Condition:      stable Instructions:   Postpartum Care After Vaginal Delivery  After you deliver your newborn (postpartum period), the usual stay in the  hospital is 24 72 hours. If there were problems with your labor or delivery, or if you have other medical problems, you might be in the hospital longer.  While you are in the hospital, you will receive help and instructions on how to care for yourself and your newborn during the postpartum period.  While you are in the hospital:  Be sure to tell your nurses if you have pain or discomfort, as well as where you feel the pain and what makes the pain worse.  If you had an incision made near your vagina (episiotomy) or if you had some tearing during delivery, the nurses may put ice packs on your episiotomy or tear. The ice packs may help to reduce the pain and swelling.  If you are breastfeeding, you may feel uncomfortable contractions of your uterus for a couple of weeks. This is normal. The contractions help your uterus get back to normal size.  It is normal to have some bleeding after delivery.  For the first 1 3 days after delivery, the flow is red and the amount may be similar to a period.  It is common for the flow to start and stop.  In the first few days, you may pass some small clots. Let your nurses know if you begin to  pass large clots or your flow increases.  Do not  flush blood clots down the toilet before having the nurse look at them.  During the next 3 10 days after delivery, your flow should become more watery and pink or brown-tinged in color.  Ten to fourteen days after delivery, your flow should be a small amount of yellowish-white discharge.  The amount of your flow will decrease over the first few weeks after delivery. Your flow may stop in 6 8 weeks. Most women have had their flow stop by 12 weeks after delivery.  You should change your sanitary pads frequently.  Wash your hands thoroughly with soap and water for at least 20 seconds after changing pads, using the toilet, or before holding or feeding your newborn.  You should feel like you need to empty your bladder  within the first 6 8 hours after delivery.  In case you become weak, lightheaded, or faint, call your nurse before you get out of bed for the first time and before you take a shower for the first time.  Within the first few days after delivery, your breasts may begin to feel tender and full. This is called engorgement. Breast tenderness usually goes away within 48 72 hours after engorgement occurs. You may also notice milk leaking from your breasts. If you are not breastfeeding, do not stimulate your breasts. Breast stimulation can make your breasts produce more milk.  Spending as much time as possible with your newborn is very important. During this time, you and your newborn can feel close and get to know each other. Having your newborn stay in your room (rooming in) will help to strengthen the bond with your newborn. It will give you time to get to know your newborn and become comfortable caring for your newborn.  Your hormones change after delivery. Sometimes the hormone changes can temporarily cause you to feel sad or tearful. These feelings should not last more than a few days. If these feelings last longer than that, you should talk to your caregiver.  If desired, talk to your caregiver about methods of family planning or contraception.  Talk to your caregiver about immunizations. Your caregiver may want you to have the following immunizations before leaving the hospital:  Tetanus, diphtheria, and pertussis (Tdap) or tetanus and diphtheria (Td) immunization. It is very important that you and your family (including grandparents) or others caring for your newborn are up-to-date with the Tdap or Td immunizations. The Tdap or Td immunization can help protect your newborn from getting ill.  Rubella immunization.  Varicella (chickenpox) immunization.  Influenza immunization. You should receive this annual immunization if you did not receive the immunization during your pregnancy. Document  Released: 07/16/2007 Document Revised: 06/12/2012 Document Reviewed: 05/15/2012 [facility] Patient Information 2014 [place].   Postpartum Depression and Baby Blues  The postpartum period begins right after the birth of a baby. During this time, there is often a great amount of joy and excitement. It is also a time of considerable changes in the life of the parent(s). Regardless of how many times a mother gives birth, each child brings new challenges and dynamics to the family. It is not unusual to have feelings of excitement accompanied by confusing shifts in moods, emotions, and thoughts. All mothers are at risk of developing postpartum depression or the "baby blues." These mood changes can occur right after giving birth, or they may occur many months after giving birth. The baby blues or postpartum depression can  be mild or severe. Additionally, postpartum depression can resolve rather quickly, or it can be a long-term condition. CAUSES Elevated hormones and their rapid decline are thought to be a main cause of postpartum depression and the baby blues. There are a number of hormones that radically change during and after pregnancy. Estrogen and progesterone usually decrease immediately after delivering your baby. The level of thyroid hormone and various cortisol steroids also rapidly drop. Other factors that play a major role in these changes include major life events and genetics.  RISK FACTORS If you have any of the following risks for the baby blues or postpartum depression, know what symptoms to watch out for during the postpartum period. Risk factors that may increase the likelihood of getting the baby blues or postpartum depression include:  Havinga personal or family history of depression.  Having depression while being pregnant.  Having premenstrual or oral contraceptive-associated mood issues.  Having exceptional life stress.  Having marital conflict.  Lacking a social support  network.  Having a baby with special needs.  Having health problems such as diabetes. SYMPTOMS Baby blues symptoms include:  Brief fluctuations in mood, such as going from extreme happiness to sadness.  Decreased concentration.  Difficulty sleeping.  Crying spells, tearfulness.  Irritability.  Anxiety. Postpartum depression symptoms typically begin within the first month after giving birth. These symptoms include:  Difficulty sleeping or excessive sleepiness.  Marked weight loss.  Agitation.  Feelings of worthlessness.  Lack of interest in activity or food. Postpartum psychosis is a very concerning condition and can be dangerous. Fortunately, it is rare. Displaying any of the following symptoms is cause for immediate medical attention. Postpartum psychosis symptoms include:  Hallucinations and delusions.  Bizarre or disorganized behavior.  Confusion or disorientation. DIAGNOSIS  A diagnosis is made by an evaluation of your symptoms. There are no medical or lab tests that lead to a diagnosis, but there are various questionnaires that a caregiver may use to identify those with the baby blues, postpartum depression, or psychosis. Often times, a screening tool called the [place] Postnatal Depression Scale is used to diagnose depression in the postpartum period.  TREATMENT The baby blues usually goes away on its own in 1 to 2 weeks. Social support is often all that is needed. You should be encouraged to get adequate sleep and rest. Occasionally, you may be given medicines to help you sleep.  Postpartum depression requires treatment as it can last several months or longer if it is not treated. Treatment may include individual or group therapy, medicine, or both to address any social, physiological, and psychological factors that may play a role in the depression. Regular exercise, a healthy diet, rest, and social support may also be strongly recommended.  Postpartum psychosis is  more serious and needs treatment right away. Hospitalization is often needed. HOME CARE INSTRUCTIONS  Get as much rest as you can. Nap when the baby sleeps.  Exercise regularly. Some women find yoga and walking to be beneficial.  Eat a balanced and nourishing diet.  Do little things that you enjoy. Have a cup of tea, take a bubble bath, read your favorite magazine, or listen to your favorite music.  Avoid alcohol.  Ask for help with household chores, cooking, grocery shopping, or running errands as needed. Do not try to do everything.  Talk to people close to you about how you are feeling. Get support from your partner, family members, friends, or other new moms.  Try to stay positive in  how you think. Think about the things you are grateful for.  Do not spend a lot of time alone.  Only take medicine as directed by your caregiver.  Keep all your postpartum appointments.  Let your caregiver know if you have any concerns. SEEK MEDICAL CARE IF: You are having a reaction or problems with your medicine. SEEK IMMEDIATE MEDICAL CARE IF:  You have suicidal feelings.  You feel you may harm the baby or someone else. Document Released: 06/22/2004 Document Revised: 12/11/2011 Document Reviewed: 07/25/2011 [facility] Patient Information 2014 [place].   Discharge to: home  Follow-up Information   [facility]. Call in 5 weeks.   Specialty:  Obstetrics and Gynecology   Contact information:   [address] [place] [id number] [phone]       Malissa HippoLILLARD,Walter Min M 10/20/2013

## 2013-10-20 NOTE — Discharge Instructions (Signed)
Vaginal Delivery Care After Refer to this sheet in the next few weeks. These discharge instructions provide you with information on caring for yourself after delivery. Your caregiver may also give you specific instructions. Your treatment has been planned according to the most current medical practices available, but problems sometimes occur. Call your caregiver if you have any problems or questions after you go home. HOME CARE INSTRUCTIONS  Take over-the-counter or prescription medicines only as directed by your caregiver or pharmacist.  Do not drink alcohol, especially if you are breastfeeding or taking medicine to relieve pain.  Do not chew or smoke tobacco.  Do not use illegal drugs.  Continue to use good perineal care. Good perineal care includes:  Wiping your perineum from front to back.  Keeping your perineum clean.  Do not use tampons or douche until your caregiver says it is okay.  Shower, wash your hair, and take tub baths as directed by your caregiver.  Wear a well-fitting bra that provides breast support.  Eat healthy foods.  Drink enough fluids to keep your urine clear or pale yellow.  Eat high-fiber foods such as whole grain cereals and breads, brown rice, beans, and fresh fruits and vegetables every day. These foods may help prevent or relieve constipation.  Follow your cargiver's recommendations regarding resumption of activities such as climbing stairs, driving, lifting, exercising, or traveling.  Talk to your caregiver about resuming sexual activities. Resumption of sexual activities is dependent upon your risk of infection, your rate of healing, and your comfort and desire to resume sexual activity.  Try to have someone help you with your household activities and your newborn for at least a few days after you leave the hospital.  Rest as much as possible. Try to rest or take a nap when your newborn is sleeping.  Increase your activities gradually.  Keep all  of your scheduled postpartum appointments. It is very important to keep your scheduled follow-up appointments. At these appointments, your caregiver will be checking to make sure that you are healing physically and emotionally. SEEK MEDICAL CARE IF:   You are passing large clots from your vagina. Save any clots to show your caregiver.  You have a foul smelling discharge from your vagina.  You have trouble urinating.  You are urinating frequently.  You have pain when you urinate.  You have a change in your bowel movements.  You have increasing redness, pain, or swelling near your vaginal incision (episiotomy) or vaginal tear.  You have pus draining from your episiotomy or vaginal tear.  Your episiotomy or vaginal tear is separating.  You have painful, hard, or reddened breasts.  You have a severe headache.  You have blurred vision or see spots.  You feel sad or depressed.  You have thoughts of hurting yourself or your newborn.  You have questions about your care, the care of your newborn, or medicines.  You are dizzy or lightheaded.  You have a rash.  You have nausea or vomiting.  You were breastfeeding and have not had a menstrual period within 12 weeks after you stopped breastfeeding.  You are not breastfeeding and have not had a menstrual period by the 12th week after delivery.  You have a fever. SEEK IMMEDIATE MEDICAL CARE IF:   You have persistent pain.  You have chest pain.  You have shortness of breath.  You faint.  You have leg pain.  You have stomach pain.  Your vaginal bleeding saturates two or more sanitary pads  in 1 hour. MAKE SURE YOU:   Understand these instructions.  Will watch your condition.  Will get help right away if you are not doing well or get worse. Document Released: 09/15/2000 Document Revised: 06/12/2012 Document Reviewed: 05/15/2012 [facility] Patient Information 2014 [place].  Breastfeeding Deciding to breastfeed  is one of the best choices you can make for you and your baby. A change in hormones during pregnancy causes your breast tissue to grow and increases the number and size of your milk ducts. These hormones also allow proteins, sugars, and fats from your blood supply to make breast milk in your milk-producing glands. Hormones prevent breast milk from being released before your baby is born as well as prompt milk flow after birth. Once breastfeeding has begun, thoughts of your baby, as well as his or her sucking or crying, can stimulate the release of milk from your milk-producing glands.  BENEFITS OF BREASTFEEDING For Your Baby  Your first milk (colostrum) helps your baby's digestive system function better.   There are antibodies in your milk that help your baby fight off infections.   Your baby has a lower incidence of asthma, allergies, and sudden infant death syndrome.   The nutrients in breast milk are better for your baby than infant formulas and are designed uniquely for your baby's needs.   Breast milk improves your baby's brain development.   Your baby is less likely to develop other conditions, such as childhood obesity, asthma, or type 2 diabetes mellitus.  For You   Breastfeeding helps to create a very special bond between you and your baby.   Breastfeeding is convenient. Breast milk is always available at the correct temperature and costs nothing.   Breastfeeding helps to burn calories and helps you lose the weight gained during pregnancy.   Breastfeeding makes your uterus contract to its prepregnancy size faster and slows bleeding (lochia) after you give birth.   Breastfeeding helps to lower your risk of developing type 2 diabetes mellitus, osteoporosis, and breast or ovarian cancer later in life. SIGNS THAT YOUR BABY IS HUNGRY Early Signs of Hunger  Increased alertness or activity.  Stretching.  Movement of the head from side to side.  Movement of the head and  opening of the mouth when the corner of the mouth or cheek is stroked (rooting).  Increased sucking sounds, smacking lips, cooing, sighing, or squeaking.  Hand-to-mouth movements.  Increased sucking of fingers or hands. Late Signs of Hunger  Fussing.  Intermittent crying. Extreme Signs of Hunger Signs of extreme hunger will require calming and consoling before your baby will be able to breastfeed successfully. Do not wait for the following signs of extreme hunger to occur before you initiate breastfeeding:   Restlessness.  A loud, strong cry.   Screaming. BREASTFEEDING BASICS Breastfeeding Initiation  Find a comfortable place to sit or lie down, with your neck and back well supported.  Place a pillow or rolled up blanket under your baby to bring him or her to the level of your breast (if you are seated). Nursing pillows are specially designed to help support your arms and your baby while you breastfeed.  Make sure that your baby's abdomen is facing your abdomen.   Gently massage your breast. With your fingertips, massage from your chest wall toward your nipple in a circular motion. This encourages milk flow. You may need to continue this action during the feeding if your milk flows slowly.  Support your breast with 4 fingers underneath  and your thumb above your nipple. Make sure your fingers are well away from your nipple and your baby's mouth.   Stroke your baby's lips gently with your finger or nipple.   When your baby's mouth is open wide enough, quickly bring your baby to your breast, placing your entire nipple and as much of the colored area around your nipple (areola) as possible into your baby's mouth.   More areola should be visible above your baby's upper lip than below the lower lip.   Your baby's tongue should be between his or her lower gum and your breast.   Ensure that your baby's mouth is correctly positioned around your nipple (latched). Your baby's  lips should create a seal on your breast and be turned out (everted).  It is common for your baby to suck about 2 3 minutes in order to start the flow of breast milk. Latching Teaching your baby how to latch on to your breast properly is very important. An improper latch can cause nipple pain and decreased milk supply for you and poor weight gain in your baby. Also, if your baby is not latched onto your nipple properly, he or she may swallow some air during feeding. This can make your baby fussy. Burping your baby when you switch breasts during the feeding can help to get rid of the air. However, teaching your baby to latch on properly is still the best way to prevent fussiness from swallowing air while breastfeeding. Signs that your baby has successfully latched on to your nipple:    Silent tugging or silent sucking, without causing you pain.   Swallowing heard between every 3 4 sucks.    Muscle movement above and in front of his or her ears while sucking.  Signs that your baby has not successfully latched on to nipple:   Sucking sounds or smacking sounds from your baby while breastfeeding.  Nipple pain. If you think your baby has not latched on correctly, slip your finger into the corner of your baby's mouth to break the suction and place it between your baby's gums. Attempt breastfeeding initiation again. Signs of Successful Breastfeeding Signs from your baby:   A gradual decrease in the number of sucks or complete cessation of sucking.   Falling asleep.   Relaxation of his or her body.   Retention of a small amount of milk in his or her mouth.   Letting go of your breast by himself or herself. Signs from you:  Breasts that have increased in firmness, weight, and size 1 3 hours after feeding.   Breasts that are softer immediately after breastfeeding.  Increased milk volume, as well as a change in milk consistency and color by the 5th day of breastfeeding.   Nipples  that are not sore, cracked, or bleeding. Signs That Your [employer] is Getting Enough Milk  Wetting at least 3 diapers in a 24-hour period. The urine should be clear and pale yellow by age 63 days.  At least 3 stools in a 24-hour period by age 63 days. The stool should be soft and yellow.  At least 3 stools in a 24-hour period by age 74 days. The stool should be seedy and yellow.  No loss of weight greater than 10% of birth weight during the first 48 days of age.  Average weight gain of 4 7 ounces (120 210 mL) per week after age 82 days.  Consistent daily weight gain by age 63 days, without weight loss  after the age of 2 weeks. After a feeding, your baby may spit up a small amount. This is common. BREASTFEEDING FREQUENCY AND DURATION Frequent feeding will help you make more milk and can prevent sore nipples and breast engorgement. Breastfeed when you feel the need to reduce the fullness of your breasts or when your baby shows signs of hunger. This is called "breastfeeding on demand." Avoid introducing a pacifier to your baby while you are working to establish breastfeeding (the first 4 6 weeks after your baby is born). After this time you may choose to use a pacifier. Research has shown that pacifier use during the first year of a baby's life decreases the risk of sudden infant death syndrome (SIDS). Allow your baby to feed on each breast as long as he or she wants. Breastfeed until your baby is finished feeding. When your baby unlatches or falls asleep while feeding from the first breast, offer the second breast. Because newborns are often sleepy in the first few weeks of life, you may need to awaken your baby to get him or her to feed. Breastfeeding times will vary from baby to baby. However, the following rules can serve as a guide to help you ensure that your baby is properly fed:  Newborns (babies 33 weeks of age or younger) may breastfeed every 1 3 hours.  Newborns should not go longer than 3 hours  during the day or 5 hours during the night without breastfeeding.  You should breastfeed your baby a minimum of 8 times in a 24-hour period until you begin to introduce solid foods to your baby at around 30 months of age. BREAST MILK PUMPING Pumping and storing breast milk allows you to ensure that your baby is exclusively fed your breast milk, even at times when you are unable to breastfeed. This is especially important if you are going back to work while you are still breastfeeding or when you are not able to be present during feedings. Your lactation consultant can give you guidelines on how long it is safe to store breast milk.  A breast pump is a machine that allows you to pump milk from your breast into a sterile bottle. The pumped breast milk can then be stored in a refrigerator or freezer. Some breast pumps are operated by hand, while others use electricity. Ask your lactation consultant which type will work best for you. Breast pumps can be purchased, but some hospitals and breastfeeding support groups lease breast pumps on a monthly basis. A lactation consultant can teach you how to hand express breast milk, if you prefer not to use a pump.  CARING FOR YOUR BREASTS WHILE YOU BREASTFEED Nipples can become dry, cracked, and sore while breastfeeding. The following recommendations can help keep your breasts moisturized and healthy:  Avoid using soap on your nipples.   Wear a supportive bra. Although not required, special nursing bras and tank tops are designed to allow access to your breasts for breastfeeding without taking off your entire bra or top. Avoid wearing underwire style bras or extremely tight bras.  Air dry your nipples for 3 81minutes after each feeding.   Use only cotton bra pads to absorb leaked breast milk. Leaking of breast milk between feedings is normal.   Use lanolin on your nipples after breastfeeding. Lanolin helps to maintain your skin's normal moisture barrier. If you  use pure lanolin you do not need to wash it off before feeding your baby again. Pure lanolin is not  toxic to your baby. You may also hand express a few drops of breast milk and gently massage that milk into your nipples and allow the milk to air dry. In the first few weeks after giving birth, some women experience extremely full breasts (engorgement). Engorgement can make your breasts feel heavy, warm, and tender to the touch. Engorgement peaks within 3 5 days after you give birth. The following recommendations can help ease engorgement:  Completely empty your breasts while breastfeeding or pumping. You may want to start by applying warm, moist heat (in the shower or with warm water-soaked hand towels) just before feeding or pumping. This increases circulation and helps the milk flow. If your baby does not completely empty your breasts while breastfeeding, pump any extra milk after he or she is finished.  Wear a snug bra (nursing or regular) or tank top for 1 2 days to signal your body to slightly decrease milk production.  Apply ice packs to your breasts, unless this is too uncomfortable for you.  Make sure that your baby is latched on and positioned properly while breastfeeding. If engorgement persists after 48 hours of following these recommendations, contact your health care provider or a Science writer. OVERALL HEALTH CARE RECOMMENDATIONS WHILE BREASTFEEDING  Eat healthy foods. Alternate between meals and snacks, eating 3 of each per day. Because what you eat affects your breast milk, some of the foods may make your baby more irritable than usual. Avoid eating these foods if you are sure that they are negatively affecting your baby.  Drink milk, fruit juice, and water to satisfy your thirst (about 10 glasses a day).   Rest often, relax, and continue to take your prenatal vitamins to prevent fatigue, stress, and anemia.  Continue breast self-awareness checks.  Avoid chewing and  smoking tobacco.  Avoid alcohol and drug use. Some medicines that may be harmful to your baby can pass through breast milk. It is important to ask your health care provider before taking any medicine, including all over-the-counter and prescription medicine as well as vitamin and herbal supplements. It is possible to become pregnant while breastfeeding. If birth control is desired, ask your health care provider about options that will be safe for your baby. SEEK MEDICAL CARE IF:   You feel like you want to stop breastfeeding or have become frustrated with breastfeeding.  You have painful breasts or nipples.  Your nipples are cracked or bleeding.  Your breasts are red, tender, or warm.  You have a swollen area on either breast.  You have a fever or chills.  You have nausea or vomiting.  You have drainage other than breast milk from your nipples.  Your breasts do not become full before feedings by the 5th day after you give birth.  You feel sad and depressed.  Your baby is too sleepy to eat well.  Your baby is having trouble sleeping.   Your baby is wetting less than 3 diapers in a 24-hour period.  Your baby has less than 3 stools in a 24-hour period.  Your baby's skin or the white part of his or her eyes becomes yellow.   Your baby is not gaining weight by 16 days of age. SEEK IMMEDIATE MEDICAL CARE IF:   Your baby is overly tired (lethargic) and does not want to wake up and feed.  Your baby develops an unexplained fever. Document Released: 09/18/2005 Document Revised: 05/21/2013 Document Reviewed: 03/12/2013 [facility] Patient Information 2014 [place].  Postpartum Depression and Baby  Blues The postpartum period begins right after the birth of a baby. During this time, there is often a great amount of joy and excitement. It is also a time of considerable changes in the life of the parent(s). Regardless of how many times a mother gives birth, each child brings new  challenges and dynamics to the family. It is not unusual to have feelings of excitement accompanied by confusing shifts in moods, emotions, and thoughts. All mothers are at risk of developing postpartum depression or the "baby blues." These mood changes can occur right after giving birth, or they may occur many months after giving birth. The baby blues or postpartum depression can be mild or severe. Additionally, postpartum depression can resolve rather quickly, or it can be a long-term condition. CAUSES Elevated hormones and their rapid decline are thought to be a main cause of postpartum depression and the baby blues. There are a number of hormones that radically change during and after pregnancy. Estrogen and progesterone usually decrease immediately after delivering your baby. The level of thyroid hormone and various cortisol steroids also rapidly drop. Other factors that play a major role in these changes include major life events and genetics.  RISK FACTORS If you have any of the following risks for the baby blues or postpartum depression, know what symptoms to watch out for during the postpartum period. Risk factors that may increase the likelihood of getting the baby blues or postpartum depression include:  Havinga personal or family history of depression.  Having depression while being pregnant.  Having premenstrual or oral contraceptive-associated mood issues.  Having exceptional life stress.  Having marital conflict.  Lacking a social support network.  Having a baby with special needs.  Having health problems such as diabetes. SYMPTOMS Baby blues symptoms include:  Brief fluctuations in mood, such as going from extreme happiness to sadness.  Decreased concentration.  Difficulty sleeping.  Crying spells, tearfulness.  Irritability.  Anxiety. Postpartum depression symptoms typically begin within the first month after giving birth. These symptoms include:  Difficulty  sleeping or excessive sleepiness.  Marked weight loss.  Agitation.  Feelings of worthlessness.  Lack of interest in activity or food. Postpartum psychosis is a very concerning condition and can be dangerous. Fortunately, it is rare. Displaying any of the following symptoms is cause for immediate medical attention. Postpartum psychosis symptoms include:  Hallucinations and delusions.  Bizarre or disorganized behavior.  Confusion or disorientation. DIAGNOSIS  A diagnosis is made by an evaluation of your symptoms. There are no medical or lab tests that lead to a diagnosis, but there are various questionnaires that a caregiver may use to identify those with the baby blues, postpartum depression, or psychosis. Often times, a screening tool called the [place] Postnatal Depression Scale is used to diagnose depression in the postpartum period.  TREATMENT The baby blues usually goes away on its own in 1 to 2 weeks. Social support is often all that is needed. You should be encouraged to get adequate sleep and rest. Occasionally, you may be given medicines to help you sleep.  Postpartum depression requires treatment as it can last several months or longer if it is not treated. Treatment may include individual or group therapy, medicine, or both to address any social, physiological, and psychological factors that may play a role in the depression. Regular exercise, a healthy diet, rest, and social support may also be strongly recommended.  Postpartum psychosis is more serious and needs treatment right away. Hospitalization is often needed. HOME  CARE INSTRUCTIONS  Get as much rest as you can. Nap when the baby sleeps.  Exercise regularly. Some women find yoga and walking to be beneficial.  Eat a balanced and nourishing diet.  Do little things that you enjoy. Have a cup of tea, take a bubble bath, read your favorite magazine, or listen to your favorite music.  Avoid alcohol.  Ask for help with  household chores, cooking, grocery shopping, or running errands as needed. Do not try to do everything.  Talk to people close to you about how you are feeling. Get support from your partner, family members, friends, or other new moms.  Try to stay positive in how you think. Think about the things you are grateful for.  Do not spend a lot of time alone.  Only take medicine as directed by your caregiver.  Keep all your postpartum appointments.  Let your caregiver know if you have any concerns. SEEK MEDICAL CARE IF: You are having a reaction or problems with your medicine. SEEK IMMEDIATE MEDICAL CARE IF:  You have suicidal feelings.  You feel you may harm the baby or someone else. Document Released: 06/22/2004 Document Revised: 12/11/2011 Document Reviewed: 06/30/2013 [facility] Patient Information 2014 [place], [place].

## 2013-10-22 NOTE — Progress Notes (Signed)
Stacy Cruz is a 23 y.o. G2P1001 at [redacted]w[redacted]d   Subjective: Pt s/p epidural at 5:27 and now c/o increased rectal pressure.  Objective: BP 95/80  Pulse 102  Temp(Src) 98.2 F (36.8 C) (Oral)  Resp 18  Ht 5\' 4"  (1.626 m)  Wt 80.74 kg (178 lb)  BMI 30.54 kg/m2  SpO2 100%  LMP 01/10/2013   FHT:  FHR: 120 bpm, variability: moderate,  accelerations:  Abscent,  decelerations:  Present Early decels present UC:   regular, every 1.5-3.5 minutes SVE:   Dilation: 4 Effacement (%): 80 Station: -1 Exam by:: [employer]: Lab Results  Component Value Date   WBC 6.4 10/18/2013   HGB 10.8* 10/18/2013   HCT 34.1* 10/18/2013   MCV 74.9* 10/18/2013   PLT 134* 10/18/2013    Assessment / Plan: Induction of labor due to postterm  Labor: Early labor Preeclampsia:  no signs or symptoms of toxicity Fetal Wellbeing:  Category II Pain Control:  Epidural I/D:  GBS neg/Afebrile Anticipated MOD:  NSVD  Continue pitocin at present and expectant management.  [place] O. 10/18/2013, 7:54 PM

## 2013-10-22 NOTE — Progress Notes (Addendum)
Stacy Cruz is a 23 y.o. G2P2002 at [redacted]w[redacted]d Subjective: Still overall comfortable but cramping has increased slightly in intensity.  Objective: BP 117/59  Pulse 97  Temp(Src) 98 F (36.7 C) (Oral)  Resp 18  Ht 5\' 4"  (1.626 m)  Wt 80.74 kg (178 lb)  BMI 30.54 kg/m2  SpO2 100%  LMP 01/10/2013   FHT:  FHR: 135 bpm, variability: moderate,  accelerations:  Present,  decelerations:  Absent UC:   regular, every 3-4 minutes. Pitocin at 61mu/min SVE:   Deferred  Labs: Lab Results  Component Value Date   WBC 6.4 10/18/2013   HGB 10.8* 10/18/2013   HCT 34.1* 10/18/2013   MCV 74.9* 10/18/2013   PLT 134* 10/18/2013    Assessment / Plan: Induction of labor due to postterm  Labor: Latent labor Preeclampsia:  no signs or symptoms of toxicity Fetal Wellbeing:  Category I Pain Control:  Labor support without medications I/D:  GBS neg/Afebrile Anticipated MOD: NSVD  Continue pitocin at present and continue expectant management.  [place] O. 10/18/2013, 2:40 PM

## 2013-10-22 NOTE — Progress Notes (Addendum)
Stacy Cruz is a 23 y.o. G2P1001 at [redacted]w[redacted]d Subjective: Pt reports feeling mild cramping.  Denies ROM or bldg.    Objective: BP 112/89  Pulse 85  Temp(Src) 98 F (36.7 C) (Oral)  Resp 18  Ht 5\' 4"  (1.626 m)  Wt 80.74 kg (178 lb)  BMI 30.54 kg/m2  LMP 01/10/2013  FHT:  FHR: 130 bpm, variability: moderate,  accelerations:  Present,  decelerations:  Absent UC:   regular, every 2-4 minutes. Pitocin at 69mu/min SVE:   Deferred at present  Labs Lab Results  Component Value Date   WBC 6.4 10/18/2013   HGB 10.8* 10/18/2013   HCT 34.1* 10/18/2013   MCV 74.9* 10/18/2013   PLT 134* 10/18/2013     Assessment / Plan: Induction of labor due to postterm  Labor: Latent labor Preeclampsia:  no signs or symptoms of toxicity Fetal Wellbeing:  Category I Pain Control:  Labor support without medications I/D:  GBS neg/Afebrile Anticipated MOD:  NSVD  Continue to titrate pitocin.  Continue current plan of care.  Beaver O. 10/19/2013, 12:35 PM

## 2014-08-03 ENCOUNTER — Encounter (HOSPITAL_COMMUNITY): Payer: Self-pay | Admitting: *Deleted

## 2015-01-08 ENCOUNTER — Encounter (HOSPITAL_COMMUNITY): Payer: Self-pay | Admitting: Emergency Medicine

## 2015-01-08 ENCOUNTER — Emergency Department (HOSPITAL_COMMUNITY)
Admission: EM | Admit: 2015-01-08 | Discharge: 2015-01-09 | Disposition: A | Discharge status: Home / Self Care | Payer: Medicaid Other | Attending: Emergency Medicine | Admitting: Emergency Medicine

## 2015-01-08 DIAGNOSIS — D270 Benign neoplasm of right ovary: Secondary | ICD-10-CM | POA: Diagnosis not present

## 2015-01-08 DIAGNOSIS — Z8619 Personal history of other infectious and parasitic diseases: Secondary | ICD-10-CM | POA: Diagnosis not present

## 2015-01-08 DIAGNOSIS — Z8742 Personal history of other diseases of the female genital tract: Secondary | ICD-10-CM | POA: Diagnosis not present

## 2015-01-08 DIAGNOSIS — R1011 Right upper quadrant pain: Secondary | ICD-10-CM | POA: Diagnosis present

## 2015-01-08 DIAGNOSIS — Z862 Personal history of diseases of the blood and blood-forming organs and certain disorders involving the immune mechanism: Secondary | ICD-10-CM | POA: Insufficient documentation

## 2015-01-08 DIAGNOSIS — R197 Diarrhea, unspecified: Secondary | ICD-10-CM | POA: Diagnosis not present

## 2015-01-08 DIAGNOSIS — Z3202 Encounter for pregnancy test, result negative: Secondary | ICD-10-CM | POA: Insufficient documentation

## 2015-01-08 DIAGNOSIS — R109 Unspecified abdominal pain: Secondary | ICD-10-CM

## 2015-01-08 LAB — CBC WITH DIFFERENTIAL/PLATELET
Basophils Absolute: 0 10*3/uL (ref 0.0–0.1)
Basophils Relative: 0 % (ref 0–1)
EOS ABS: 0.3 10*3/uL (ref 0.0–0.7)
EOS PCT: 3 % (ref 0–5)
HEMATOCRIT: 37.2 % (ref 36.0–46.0)
Hemoglobin: 11.3 g/dL — ABNORMAL LOW (ref 12.0–15.0)
Lymphocytes Relative: 20 % (ref 12–46)
Lymphs Abs: 1.8 10*3/uL (ref 0.7–4.0)
MCH: 22.7 pg — ABNORMAL LOW (ref 26.0–34.0)
MCHC: 30.4 g/dL (ref 30.0–36.0)
MCV: 74.8 fL — ABNORMAL LOW (ref 78.0–100.0)
MONOS PCT: 6 % (ref 3–12)
Monocytes Absolute: 0.5 10*3/uL (ref 0.1–1.0)
NEUTROS ABS: 6.5 10*3/uL (ref 1.7–7.7)
Neutrophils Relative %: 71 % (ref 43–77)
PLATELETS: 239 10*3/uL (ref 150–400)
RBC: 4.97 MIL/uL (ref 3.87–5.11)
RDW: 15.9 % — ABNORMAL HIGH (ref 11.5–15.5)
WBC: 9.1 10*3/uL (ref 4.0–10.5)

## 2015-01-08 LAB — COMPREHENSIVE METABOLIC PANEL
ALBUMIN: 3.8 g/dL (ref 3.5–5.2)
ALT: 16 U/L (ref 0–35)
ANION GAP: 7 (ref 5–15)
AST: 16 U/L (ref 0–37)
Alkaline Phosphatase: 49 U/L (ref 39–117)
BILIRUBIN TOTAL: 0.6 mg/dL (ref 0.3–1.2)
BUN: 8 mg/dL (ref 6–23)
CHLORIDE: 105 mmol/L (ref 96–112)
CO2: 25 mmol/L (ref 19–32)
Calcium: 9 mg/dL (ref 8.4–10.5)
Creatinine, Ser: 1.01 mg/dL (ref 0.50–1.10)
GFR calc Af Amer: >90 mL/min (ref >90)
GFR, EST NON AFRICAN AMERICAN: 78 mL/min — AB (ref >90)
GLUCOSE: 100 mg/dL — AB (ref 70–99)
Potassium: 3.7 mmol/L (ref 3.5–5.1)
Sodium: 137 mmol/L (ref 135–145)
Total Protein: 7.2 g/dL (ref 6.0–8.3)

## 2015-01-08 MED ORDER — SODIUM CHLORIDE 0.9 % IV SOLN
1000.0000 mL | INTRAVENOUS | Status: DC
  Administered 2015-01-09: 1000 mL via INTRAVENOUS

## 2015-01-08 MED ORDER — ONDANSETRON HCL 4 MG/2ML IJ SOLN
4.0000 mg | Freq: Once | INTRAMUSCULAR | Status: AC
  Administered 2015-01-08: 4 mg via INTRAVENOUS
  Filled 2015-01-08: qty 2

## 2015-01-08 MED ORDER — SODIUM CHLORIDE 0.9 % IV SOLN
1000.0000 mL | Freq: Once | INTRAVENOUS | Status: AC
  Administered 2015-01-08: 1000 mL via INTRAVENOUS

## 2015-01-08 MED ORDER — MORPHINE SULFATE 4 MG/ML IJ SOLN
4.0000 mg | Freq: Once | INTRAMUSCULAR | Status: AC
  Administered 2015-01-08: 4 mg via INTRAVENOUS
  Filled 2015-01-08: qty 1

## 2015-01-08 NOTE — ED Provider Notes (Signed)
CSN: [id number]     Arrival date & time 01/08/15  2146 History  This chart was scribed for Delora Fuel, MD by Jeanell Sparrow, ED Scribe. This patient was seen in room B17C/B17C and the patient's care was started at 11:25 PM.   Chief Complaint  Patient presents with  . Abdominal Pain    The patient said she has been having abdominal pain siince last night.  She also says she has been having pressure when she pees.  She also describes having sharp pain in her lower abdomen.    The history is provided by the patient. No language interpreter was used.   HPI Comments: Stacy Cruz is a 24 y.o. female who presents to the Emergency Department complaining of constant moderate middle abdominal pain that started yesterday. She reports that she started having middle abdominal pain that the pain radiates to her lower abdomen yesterday. She describes the pain as a sharp sensation and currently rates the severity of the pain as a 8/10. She states that sitting and coughing exacerbates the pain, and nothing provides relief. She reports no change in pain with a BM and mild pain with urination. She reports having 2 episodes of diarrhea . She states having her LMNP about a week ago. She denies any nausea, constipation, unusual vaginal discharge, fever, chills, or diaphoresis.   Past Medical History  Diagnosis Date  . BV (bacterial vaginosis)     H/O  . H/O candidiasis   . Anemia    Past Surgical History  Procedure Laterality Date  . Wisdom tooth extraction  2011   Family History  Problem Relation Age of Onset  . Anesthesia problems Neg Hx   . Hypotension Neg Hx   . Malignant hyperthermia Neg Hx   . Pseudochol deficiency Neg Hx   . Heart disease Sister   . Hypertension Maternal Grandmother   . Diabetes Maternal Grandmother   . Cancer Maternal Grandmother   . Hypertension Maternal Grandfather   . Diabetes Maternal Grandfather   . Cancer Maternal Grandfather   . Diabetes Paternal Grandmother   .  Hypertension Paternal Grandmother   . Heart disease Paternal Grandfather   . Hypertension Paternal Grandfather   . Diabetes Maternal Uncle    History  Substance Use Topics  . Smoking status: Never Smoker   . Smokeless tobacco: Never Used  . Alcohol Use: No   OB History    Gravida Para Term Preterm AB TAB SAB Ectopic Multiple Living   2 2 2  0 0 0 0 0 0 2     Review of Systems  Constitutional: Negative for fever, chills and diaphoresis.  Gastrointestinal: Positive for abdominal pain and diarrhea. Negative for nausea and constipation.  Genitourinary: Negative for dysuria and vaginal discharge.  All other systems reviewed and are negative.  Allergies  Review of patient's allergies indicates no known allergies.  Home Medications   Prior to Admission medications   Medication Sig Start Date End Date Taking? Authorizing Provider  ibuprofen (ADVIL,MOTRIN) 600 MG tablet Take 1 tablet (600 mg total) by mouth every 6 (six) hours. Patient not taking: Reported on 01/08/2015 10/20/13   Arville Go, CNM  iron polysaccharides (NIFEREX) 150 MG capsule Take 1 capsule (150 mg total) by mouth daily. Patient not taking: Reported on 01/08/2015 10/20/13   Arville Go, CNM  oxyCODONE-acetaminophen (PERCOCET/ROXICET) 5-325 MG per tablet Take 1-2 tablets by mouth every 4 (four) hours as needed for severe pain (moderate - severe pain). Patient not taking: Reported  on 01/08/2015 10/20/13   Arville Go, CNM   BP 116/69 mmHg  Pulse 92  Temp(Src) 98.2 F (36.8 C) (Oral)  Resp 16  SpO2 100%  LMP 12/31/2014 Physical Exam  Constitutional: She is oriented to person, place, and time. She appears well-developed and well-nourished. No distress.  HENT:  Head: Normocephalic and atraumatic.  Eyes: Pupils are equal, round, and reactive to light.  Neck: Normal range of motion. Neck supple. No JVD present.  Cardiovascular: Normal rate, regular rhythm and normal heart sounds.   No murmur  heard. Pulmonary/Chest: Effort normal and breath sounds normal. She has no wheezes. She has no rales. She exhibits no tenderness.  Abdominal: Soft. She exhibits no distension and no mass. There is tenderness. There is rebound. There is no guarding.  Mild TTP to RUQ and LLQ. Moderate TTP in RLQ with rebound but no guarding. Bowel sounds decreased.   Genitourinary:  Normal external female genitalia. Cervix is closed with mucoid and slightly blood-tinged discharge. Fundus normal size and position. No adnexal masses or tenderness. No cervical motion tenderness. IUD string visible.  Musculoskeletal: Normal range of motion. She exhibits no edema.  Lymphadenopathy:    She has no cervical adenopathy.  Neurological: She is alert and oriented to person, place, and time. No cranial nerve deficit. Coordination normal.  Skin: Skin is warm and dry. No rash noted.  Psychiatric: She has a normal mood and affect. Her behavior is normal. Judgment and thought content normal.  Nursing note and vitals reviewed.   ED Course  Procedures (including critical care time) DIAGNOSTIC STUDIES: Oxygen Saturation is 100% on RA, normal by my interpretation.    COORDINATION OF CARE: 11:29 PM- Pt advised of plan for treatment which includes medication and labs and pt agrees.  Labs Review Results for orders placed or performed during the hospital encounter of 01/08/15  Wet prep, genital  Result Value Ref Range   Yeast Wet Prep HPF POC NONE SEEN NONE SEEN   Trich, Wet Prep NONE SEEN NONE SEEN   Clue Cells Wet Prep HPF POC NONE SEEN NONE SEEN   WBC, Wet Prep HPF POC MODERATE (A) NONE SEEN  CBC with Differential  Result Value Ref Range   WBC 9.1 4.0 - 10.5 K/uL   RBC 4.97 3.87 - 5.11 MIL/uL   Hemoglobin 11.3 (L) 12.0 - 15.0 g/dL   HCT 37.2 36.0 - 46.0 %   MCV 74.8 (L) 78.0 - 100.0 fL   MCH 22.7 (L) 26.0 - 34.0 pg   MCHC 30.4 30.0 - 36.0 g/dL   RDW 15.9 (H) 11.5 - 15.5 %   Platelets 239 150 - 400 K/uL    Neutrophils Relative % 71 43 - 77 %   Lymphocytes Relative 20 12 - 46 %   Monocytes Relative 6 3 - 12 %   Eosinophils Relative 3 0 - 5 %   Basophils Relative 0 0 - 1 %   Neutro Abs 6.5 1.7 - 7.7 K/uL   Lymphs Abs 1.8 0.7 - 4.0 K/uL   Monocytes Absolute 0.5 0.1 - 1.0 K/uL   Eosinophils Absolute 0.3 0.0 - 0.7 K/uL   Basophils Absolute 0.0 0.0 - 0.1 K/uL   WBC Morphology TOXIC GRANULATION   Comprehensive metabolic panel  Result Value Ref Range   Sodium 137 135 - 145 mmol/L   Potassium 3.7 3.5 - 5.1 mmol/L   Chloride 105 96 - 112 mmol/L   CO2 25 19 - 32 mmol/L   Glucose, Bld 100 (  H) 70 - 99 mg/dL   BUN 8 6 - 23 mg/dL   Creatinine, Ser 1.01 0.50 - 1.10 mg/dL   Calcium 9.0 8.4 - 10.5 mg/dL   Total Protein 7.2 6.0 - 8.3 g/dL   Albumin 3.8 3.5 - 5.2 g/dL   AST 16 0 - 37 U/L   ALT 16 0 - 35 U/L   Alkaline Phosphatase 49 39 - 117 U/L   Total Bilirubin 0.6 0.3 - 1.2 mg/dL   GFR calc non Af Amer 78 (L) >90 mL/min   GFR calc Af Amer >90 >90 mL/min   Anion gap 7 5 - 15  Urinalysis, Routine w reflex microscopic  Result Value Ref Range   Color, Urine YELLOW YELLOW   APPearance CLEAR CLEAR   Specific Gravity, Urine 1.010 1.005 - 1.030   pH 5.5 5.0 - 8.0   Glucose, UA NEGATIVE NEGATIVE mg/dL   Hgb urine dipstick NEGATIVE NEGATIVE   Bilirubin Urine NEGATIVE NEGATIVE   Ketones, ur NEGATIVE NEGATIVE mg/dL   Protein, ur NEGATIVE NEGATIVE mg/dL   Urobilinogen, UA 0.2 0.0 - 1.0 mg/dL   Nitrite NEGATIVE NEGATIVE   Leukocytes, UA TRACE (A) NEGATIVE  Lipase, blood  Result Value Ref Range   Lipase 19 11 - 59 U/L  Urine microscopic-add on  Result Value Ref Range   Squamous Epithelial / LPF FEW (A) RARE   WBC, UA 0-2 <3 WBC/hpf   Bacteria, UA RARE RARE  POC Urine Pregnancy, ED (do NOT order at [facility])  Result Value Ref Range   Preg Test, Ur NEGATIVE NEGATIVE   Imaging Review Ct Abdomen Pelvis W Contrast  01/09/2015   CLINICAL DATA:  Mid to low abdominal pain and diarrhea since Wednesday.   EXAM: CT ABDOMEN AND PELVIS WITH CONTRAST  TECHNIQUE: Multidetector CT imaging of the abdomen and pelvis was performed using the standard protocol following bolus administration of intravenous contrast.  CONTRAST:  174mL OMNIPAQUE IOHEXOL 300 MG/ML  SOLN  COMPARISON:  None.  FINDINGS: Lung bases are clear.  The liver, spleen, gallbladder, pancreas, adrenal glands, kidneys, abdominal aorta, inferior vena cava, and retroperitoneal lymph nodes are unremarkable. Small accessory spleen. Stomach, small bowel, and colon are not abnormally distended. No free air or free fluid in the abdomen. Abdominal wall musculature appears intact.  Pelvis: The appendix is normal. There is a a right adnexal mass containing fat, solid components, and calcification consistent with a dermoid cyst. This measures about 6.3 x 6.4 cm in diameter. No left adnexal mass. Probable involuting cyst in the left ovary. Intrauterine device is present and localized in the lower uterine segment and endocervical region. Uterus is not enlarged. No free or loculated pelvic fluid collections. No pelvic lymphadenopathy. No destructive bone lesions.  IMPRESSION: 6.4 cm diameter dermoid cyst in the right adnexal region. Low placement of intrauterine device. Appendix is normal. No evidence of bowel obstruction.   Electronically Signed   By: Lucienne Capers M.D.   On: 01/09/2015 03:37     MDM   Final diagnoses:  Abdominal pain, unspecified abdominal location  Dermoid cyst of ovary, right   Lower abdominal pain with tenderness fairly well localized to right lower quadrant. This is worrisome for appendicitis. She will be sent for CT of abdomen and pelvis.  CT shows fairly large dermoid cyst. This apparently accounts for her pain. No evidence of appendicitis. She is referred to GYN for outpatient evaluation for possible elective surgery. She is discharged with prescriptions for oxycodone have acetaminophen and metoclopramide.  Return if pain is not being  adequately controlled.  I personally performed the services described in this documentation, which was scribed in my presence. The recorded information has been reviewed and is accurate.       Delora Fuel, MD [id number]

## 2015-01-08 NOTE — ED Notes (Addendum)
The patient said she has been having abdominal pain siince last night along with some diarrhea.  She also says she has been having pressure when she pees.  She also describes having sharp pain in her lower abdomen.  She says she had an IUS placed last year after her daughter was born and is concerned something is going on with it.  She denies any foul odor or abnormal discharge.

## 2015-01-09 ENCOUNTER — Encounter (HOSPITAL_COMMUNITY): Payer: Self-pay | Admitting: Radiology

## 2015-01-09 ENCOUNTER — Emergency Department (HOSPITAL_COMMUNITY): Payer: Medicaid Other

## 2015-01-09 LAB — URINALYSIS, ROUTINE W REFLEX MICROSCOPIC
Bilirubin Urine: NEGATIVE
Glucose, UA: NEGATIVE mg/dL
Hgb urine dipstick: NEGATIVE
Ketones, ur: NEGATIVE mg/dL
Nitrite: NEGATIVE
PH: 5.5 (ref 5.0–8.0)
Protein, ur: NEGATIVE mg/dL
Specific Gravity, Urine: 1.01 (ref 1.005–1.030)
Urobilinogen, UA: 0.2 mg/dL (ref 0.0–1.0)

## 2015-01-09 LAB — WET PREP, GENITAL
Clue Cells Wet Prep HPF POC: NONE SEEN
Trich, Wet Prep: NONE SEEN
Yeast Wet Prep HPF POC: NONE SEEN

## 2015-01-09 LAB — URINE MICROSCOPIC-ADD ON

## 2015-01-09 LAB — LIPASE, BLOOD: Lipase: 19 U/L (ref 11–59)

## 2015-01-09 LAB — POC URINE PREG, ED: PREG TEST UR: NEGATIVE

## 2015-01-09 MED ORDER — IOHEXOL 300 MG/ML  SOLN
100.0000 mL | Freq: Once | INTRAMUSCULAR | Status: AC | PRN
  Administered 2015-01-09: 100 mL via INTRAVENOUS

## 2015-01-09 MED ORDER — METOCLOPRAMIDE HCL 10 MG PO TABS: 10.0000 mg | ORAL_TABLET | Freq: Four times a day (QID) | ORAL | Status: DC | PRN

## 2015-01-09 MED ORDER — MORPHINE SULFATE 4 MG/ML IJ SOLN
4.0000 mg | Freq: Once | INTRAMUSCULAR | Status: AC
  Administered 2015-01-09: 4 mg via INTRAVENOUS
  Filled 2015-01-09: qty 1

## 2015-01-09 MED ORDER — OXYCODONE-ACETAMINOPHEN 5-325 MG PO TABS: 1.0000 | ORAL_TABLET | ORAL | Status: DC | PRN

## 2015-01-09 MED ORDER — IOHEXOL 300 MG/ML  SOLN
25.0000 mL | Freq: Once | INTRAMUSCULAR | Status: AC | PRN
  Administered 2015-01-09: 25 mL via ORAL

## 2015-01-09 NOTE — ED Notes (Signed)
The pt returned from  c-t approx 15 minutes ago.  C/o pain wants more pain med

## 2015-01-09 NOTE — ED Notes (Signed)
Pain med given 

## 2015-01-09 NOTE — Discharge Instructions (Signed)
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 Please make an appointment with the gynecologist. They will discuss treatment options with you.  Acetaminophen; Oxycodone tablets What is this medicine? ACETAMINOPHEN; OXYCODONE (a set a MEE noe fen; ox i KOE done) is a pain reliever. It is used to treat mild to moderate pain. This medicine may be used for other purposes; ask your health care provider or pharmacist if you have questions. COMMON BRAND NAME(S): Endocet, Magnacet, Narvox, Percocet, Perloxx, Primalev, Primlev, Roxicet, Xolox What should I tell my health care provider before I take this medicine? They need to know if you have any of these conditions: -brain tumor -Crohn's disease, inflammatory bowel disease, or ulcerative colitis -drug abuse or addiction -head injury -heart or circulation problems -if you often drink alcohol -kidney disease or problems going to the bathroom -liver disease -lung disease, asthma, or breathing problems -an unusual or allergic reaction to acetaminophen, oxycodone, other opioid analgesics, other medicines, foods, dyes, or preservatives -pregnant or trying to get pregnant -breast-feeding How should I use this medicine? Take this medicine by mouth with a full glass of water. Follow the directions on the prescription label. Take your medicine at regular intervals. Do not take your medicine more often than directed. Talk to your pediatrician regarding the use of this medicine in children. Special care may be needed. Patients over 12 years old may have a stronger reaction and need a smaller dose. Overdosage: If you think you have taken too much of this medicine contact a poison control center or emergency room at once. NOTE: This medicine is only for you. Do not share this medicine with others. What if I miss a dose? If you miss a dose, take it as soon as you can. If it is almost time for your next dose, take only that dose. Do not take double or  extra doses. What may interact with this medicine? -alcohol -antihistamines -barbiturates like amobarbital, butalbital, butabarbital, methohexital, pentobarbital, phenobarbital, thiopental, and secobarbital -benztropine -drugs for bladder problems like solifenacin, trospium, oxybutynin, tolterodine, hyoscyamine, and methscopolamine -drugs for breathing problems like ipratropium and tiotropium -drugs for certain stomach or intestine problems like propantheline, homatropine methylbromide, glycopyrrolate, atropine, belladonna, and dicyclomine -general anesthetics like etomidate, ketamine, nitrous oxide, propofol, desflurane, enflurane, halothane, isoflurane, and sevoflurane -medicines for depression, anxiety, or psychotic disturbances -medicines for sleep -muscle relaxants -naltrexone -narcotic medicines (opiates) for pain -phenothiazines like perphenazine, thioridazine, chlorpromazine, mesoridazine, fluphenazine, prochlorperazine, promazine, and trifluoperazine -scopolamine -tramadol -trihexyphenidyl This list may not describe all possible interactions. Give your health care provider a list of all the medicines, herbs, non-prescription drugs, or dietary supplements you use. Also tell them if you smoke, drink alcohol, or use illegal drugs. Some items may interact with your medicine. What should I watch for while using this medicine? Tell your doctor or health care professional if your pain does not go away, if it gets worse, or if you have new or a different type of pain. You may develop tolerance to the medicine. Tolerance means that you will need a higher dose of the medication for pain relief. Tolerance is normal and is expected if you take this medicine for a long time. Do not suddenly stop taking your medicine because you may develop a severe reaction. Your body becomes used to the medicine. This does NOT mean you are addicted. Addiction is a behavior related to getting and using a drug for a  non-medical reason. If you have pain, you have a medical reason  to take pain medicine. Your doctor will tell you how much medicine to take. If your doctor wants you to stop the medicine, the dose will be slowly lowered over time to avoid any side effects. You may get drowsy or dizzy. Do not drive, use machinery, or do anything that needs mental alertness until you know how this medicine affects you. Do not stand or sit up quickly, especially if you are an older patient. This reduces the risk of dizzy or fainting spells. Alcohol may interfere with the effect of this medicine. Avoid alcoholic drinks. There are different types of narcotic medicines (opiates) for pain. If you take more than one type at the same time, you may have more side effects. Give your health care provider a list of all medicines you use. Your doctor will tell you how much medicine to take. Do not take more medicine than directed. Call emergency for help if you have problems breathing. The medicine will cause constipation. Try to have a bowel movement at least every 2 to 3 days. If you do not have a bowel movement for 3 days, call your doctor or health care professional. Do not take Tylenol (acetaminophen) or medicines that have acetaminophen with this medicine. Too much acetaminophen can be very dangerous. Many nonprescription medicines contain acetaminophen. Always read the labels carefully to avoid taking more acetaminophen. What side effects may I notice from receiving this medicine? Side effects that you should report to your doctor or health care professional as soon as possible: -allergic reactions like skin rash, itching or hives, swelling of the face, lips, or tongue -breathing difficulties, wheezing -confusion -light headedness or fainting spells -severe stomach pain -unusually weak or tired -yellowing of the skin or the whites of the eyes Side effects that usually do not require medical attention (report to your doctor or  health care professional if they continue or are bothersome): -dizziness -drowsiness -nausea -vomiting This list may not describe all possible side effects. Call your doctor for medical advice about side effects. You may report side effects to FDA at 1-800-FDA-1088. Where should I keep my medicine? Keep out of the reach of children. This medicine can be abused. Keep your medicine in a safe place to protect it from theft. Do not share this medicine with anyone. Selling or giving away this medicine is dangerous and against the law. Store at room temperature between 20 and 25 degrees C (68 and 77 degrees F). Keep container tightly closed. Protect from light. This medicine may cause accidental overdose and death if it is taken by other adults, children, or pets. Flush any unused medicine down the toilet to reduce the chance of harm. Do not use the medicine after the expiration date. NOTE: This sheet is a summary. It may not cover all possible information. If you have questions about this medicine, talk to your doctor, pharmacist, or health care provider.  2015, Elsevier/[employer] Standard. (2013-05-12 13:17:35)  Metoclopramide tablets What is this medicine? METOCLOPRAMIDE (met oh kloe PRA mide) is used to treat the symptoms of gastroesophageal reflux disease (GERD) like heartburn. It is also used to treat people with slow emptying of the stomach and intestinal tract. This medicine may be used for other purposes; ask your health care provider or pharmacist if you have questions. COMMON BRAND NAME(S): Reglan What should I tell my health care provider before I take this medicine? They need to know if you have any of these conditions: -breast cancer -depression -diabetes -heart failure -high blood pressure -  kidney disease -liver disease -Parkinson's disease or a movement disorder -pheochromocytoma -seizures -stomach obstruction, bleeding, or perforation -an unusual or allergic reaction to  metoclopramide, procainamide, sulfites, other medicines, foods, dyes, or preservatives -pregnant or trying to get pregnant -breast-feeding How should I use this medicine? Take this medicine by mouth with a glass of water. Follow the directions on the prescription label. Take this medicine on an empty stomach, about 30 minutes before eating. Take your doses at regular intervals. Do not take your medicine more often than directed. Do not stop taking except on the advice of your doctor or health care professional. A special MedGuide will be given to you by the pharmacist with each prescription and refill. Be sure to read this information carefully each time. Talk to your pediatrician regarding the use of this medicine in children. Special care may be needed. Overdosage: If you think you have taken too much of this medicine contact a poison control center or emergency room at once. NOTE: This medicine is only for you. Do not share this medicine with others. What if I miss a dose? If you miss a dose, take it as soon as you can. If it is almost time for your next dose, take only that dose. Do not take double or extra doses. What may interact with this medicine? -acetaminophen -cyclosporine -digoxin -medicines for blood pressure -medicines for diabetes, including insulin -medicines for hay fever and other allergies -medicines for depression, especially an Monoamine Oxidase Inhibitor (MAOI) -medicines for Parkinson's disease, like levodopa -medicines for sleep or for pain -tetracycline This list may not describe all possible interactions. Give your health care provider a list of all the medicines, herbs, non-prescription drugs, or dietary supplements you use. Also tell them if you smoke, drink alcohol, or use illegal drugs. Some items may interact with your medicine. What should I watch for while using this medicine? It may take a few weeks for your stomach condition to start to get better. However,  do not take this medicine for longer than 12 weeks. The longer you take this medicine, and the more you take it, the greater your chances are of developing serious side effects. If you are an elderly patient, a female patient, or you have diabetes, you may be at an increased risk for side effects from this medicine. Contact your doctor immediately if you start having movements you cannot control such as lip smacking, rapid movements of the tongue, involuntary or uncontrollable movements of the eyes, head, arms and legs, or muscle twitches and spasms. Patients and their families should watch out for worsening depression or thoughts of suicide. Also watch out for any sudden or severe changes in feelings such as feeling anxious, agitated, panicky, irritable, hostile, aggressive, impulsive, severely restless, overly excited and hyperactive, or not being able to sleep. If this happens, especially at the beginning of treatment or after a change in dose, call your doctor. Do not treat yourself for high fever. Ask your doctor or health care professional for advice. You may get drowsy or dizzy. Do not drive, use machinery, or do anything that needs mental alertness until you know how this drug affects you. Do not stand or sit up quickly, especially if you are an older patient. This reduces the risk of dizzy or fainting spells. Alcohol can make you more drowsy and dizzy. Avoid alcoholic drinks. What side effects may I notice from receiving this medicine? Side effects that you should report to your doctor or health care professional as  soon as possible: -allergic reactions like skin rash, itching or hives, swelling of the face, lips, or tongue -abnormal production of milk in females -breast enlargement in both males and females -change in the way you walk -difficulty moving, speaking or swallowing -drooling, lip smacking, or rapid movements of the tongue -excessive sweating -fever -involuntary or uncontrollable  movements of the eyes, head, arms and legs -irregular heartbeat or palpitations -muscle twitches and spasms -unusually weak or tired Side effects that usually do not require medical attention (report to your doctor or health care professional if they continue or are bothersome): -change in sex drive or performance -depressed mood -diarrhea -difficulty sleeping -headache -menstrual changes -restless or nervous This list may not describe all possible side effects. Call your doctor for medical advice about side effects. You may report side effects to FDA at 1-800-FDA-1088. Where should I keep my medicine? Keep out of the reach of children. Store at room temperature between 20 and 25 degrees C (68 and 77 degrees F). Protect from light. Keep container tightly closed. Throw away any unused medicine after the expiration date. NOTE: This sheet is a summary. It may not cover all possible information. If you have questions about this medicine, talk to your doctor, pharmacist, or health care provider.  2015, Elsevier/Gold Standard. (2012-01-16 13:04:38)

## 2015-01-10 LAB — RPR: RPR Ser Ql: NONREACTIVE

## 2015-01-11 ENCOUNTER — Encounter: Payer: Self-pay | Admitting: Obstetrics & Gynecology

## 2015-01-11 ENCOUNTER — Telehealth: Payer: Self-pay | Admitting: Obstetrics & Gynecology

## 2015-01-11 ENCOUNTER — Ambulatory Visit (INDEPENDENT_AMBULATORY_CARE_PROVIDER_SITE_OTHER): Payer: Medicaid Other | Admitting: Obstetrics & Gynecology

## 2015-01-11 VITALS — BP 134/74 | HR 106 | Temp 99.6°F | Ht 64.0 in | Wt 191.5 lb

## 2015-01-11 DIAGNOSIS — D270 Benign neoplasm of right ovary: Secondary | ICD-10-CM

## 2015-01-11 LAB — GC/CHLAMYDIA PROBE AMP (~~LOC~~) NOT AT ARMC
Chlamydia: NEGATIVE
NEISSERIA GONORRHEA: POSITIVE — AB

## 2015-01-11 NOTE — Telephone Encounter (Signed)
TC to pt to remind her to be NPO after midnight.  Britton Bera L. Harraway-Smith, M.D., Cherlynn June

## 2015-01-11 NOTE — Patient Instructions (Signed)
Ovarian Cystectomy Ovarian cystectomy is surgery to remove a fluid-filled sac (cyst) on an ovary. The ovaries are small organs that produce eggs in women. Various types of cysts can form on the ovaries. Most are not cancerous. Surgery may be done if a cyst is large or is causing symptoms such as pain. It may also be done for a cyst that is or might be cancerous. This surgery can be done using a laparoscopic technique or an open abdominal technique. The laparoscopic technique involves smaller cuts (incisions) and a faster recovery time. The technique used will depend on your age, the type of cyst, and whether the cyst is cancerous. The laparoscopic technique is not used for a cancerous cyst. LET YOUR HEALTH CARE PROVIDER KNOW ABOUT:   Any allergies you have.  All medicines you are taking, including vitamins, herbs, eye drops, creams, and over-the-counter medicines.  Previous problems you or members of your family have had with the use of anesthetics.  Any blood disorders you have.  Previous surgeries you have had.  Medical conditions you have.  Any chance you might be pregnant. RISKS AND COMPLICATIONS Generally, this is a safe procedure. However, as with any procedure, complications can occur. Possible complications include:  Excessive bleeding.  Infection.  Injury to other organs.  Blood clots.  Becoming incapable of getting pregnant (infertile). BEFORE THE PROCEDURE  Ask your health care provider about changing or stopping any regular medicines. Avoid taking aspirin, ibuprofen, or blood thinners as directed by your health care provider.  Do not eat or drink anything after midnight the night before surgery.  If you smoke, do not smoke for at least 2 weeks before your surgery.  Do not drink alcohol the day before your surgery.  Let your health care provider know if you develop a cold or any infection before your surgery.  Arrange for someone to drive you home after the  procedure or after your hospital stay. Also arrange for someone to help you with activities during recovery. PROCEDURE  Either a laparoscopic technique or an open abdominal technique may be used for this surgery.  Small monitors will be put on your body. They are used to check your heart, blood pressure, and oxygen level.   An IV access tube will be put into one of your veins. Medicine will be able to flow directly into your body through this IV tube.   You might be given a medicine to help you relax (sedative).   You will be given a medicine to make you sleep (general anesthetic). A breathing tube may be placed into your lungs during the procedure. Laparoscopic Technique  Several small cuts (incisions) are made in your abdomen. These are typically about 1 to 2 cm long.   Your abdomen will be filled with carbon dioxide gas so that it expands. This gives the surgeon more room to operate and makes your organs easier to see.   A thin, lighted tube with a tiny camera on the end (laparoscope) is put through one of the small incisions. The camera on the laparoscope sends a picture to a TV screen in the operating room. This gives the surgeon a good view inside your abdomen.   Hollow tubes are put through the other small incisions in your abdomen. The tools needed for the procedure are put through these tubes.  The ovary with the cyst is identified, and the cyst is removed. It is sent to the lab for testing. If it is cancer, both ovaries   may need to be removed during a different surgery.  Tools are removed. The incisions are then closed with stitches or skin glue, and dressings may be applied. Open Abdominal Technique  A single large incision is made along your bikini line or in the middle of your lower abdomen.  The ovary with the cyst is identified, and the cyst is removed. It is sent to the lab for testing. If it is cancer, both ovaries may need to be removed during a different  surgery.  The incision is then closed with stitches or staples. AFTER THE PROCEDURE   You will wake up from anesthesia and be taken to a recovery area.  If you had laparoscopic surgery, you may be able to go home the same day, or you may need to stay in the hospital overnight.  If you had open abdominal surgery, you will need to stay in the hospital for a few days.  Your IV access tube and catheter will be removed the first or second day, after you are able to eat and drink enough.  You may be given medicine to relieve pain or to help you sleep.  You may be given an antibiotic medicine if needed. Document Released: 07/16/2007 Document Revised: 07/09/2013 Document Reviewed: 04/30/2013 [employer] Patient Information 2015 [place], [place]. This information is not intended to replace advice given to you by your health care provider. Make sure you discuss any questions you have with your health care provider. Ovarian Cyst An ovarian cyst is a fluid-filled sac that forms on an ovary. The ovaries are small organs that produce eggs in women. Various types of cysts can form on the ovaries. Most are not cancerous. Many do not cause problems, and they often go away on their own. Some may cause symptoms and require treatment. Common types of ovarian cysts include:  Functional cysts--These cysts may occur every month during the menstrual cycle. This is normal. The cysts usually go away with the next menstrual cycle if the woman does not get pregnant. Usually, there are no symptoms with a functional cyst.  Endometrioma cysts--These cysts form from the tissue that lines the uterus. They are also called "chocolate cysts" because they become filled with blood that turns brown. This type of cyst can cause pain in the lower abdomen during intercourse and with your menstrual period.  Cystadenoma cysts--This type develops from the cells on the outside of the ovary. These cysts can get very big and cause lower  abdomen pain and pain with intercourse. This type of cyst can twist on itself, cut off its blood supply, and cause severe pain. It can also easily rupture and cause a lot of pain.  Dermoid cysts--This type of cyst is sometimes found in both ovaries. These cysts may contain different kinds of body tissue, such as skin, teeth, hair, or cartilage. They usually do not cause symptoms unless they get very big.  Theca lutein cysts--These cysts occur when too much of a certain hormone (human chorionic gonadotropin) is produced and overstimulates the ovaries to produce an egg. This is most common after procedures used to assist with the conception of a baby (in vitro fertilization). CAUSES   Fertility drugs can cause a condition in which multiple large cysts are formed on the ovaries. This is called ovarian hyperstimulation syndrome.  A condition called polycystic ovary syndrome can cause hormonal imbalances that can lead to nonfunctional ovarian cysts. SIGNS AND SYMPTOMS  Many ovarian cysts do not cause symptoms. If symptoms are present,  they may include:  Pelvic pain or pressure.  Pain in the lower abdomen.  Pain during sexual intercourse.  Increasing girth (swelling) of the abdomen.  Abnormal menstrual periods.  Increasing pain with menstrual periods.  Stopping having menstrual periods without being pregnant. DIAGNOSIS  These cysts are commonly found during a routine or annual pelvic exam. Tests may be ordered to find out more about the cyst. These tests may include:  Ultrasound.  X-ray of the pelvis.  CT scan.  MRI.  Blood tests. TREATMENT  Many ovarian cysts go away on their own without treatment. Your health care provider may want to check your cyst regularly for 2-3 months to see if it changes. For women in menopause, it is particularly important to monitor a cyst closely because of the higher rate of ovarian cancer in menopausal women. When treatment is needed, it may include  any of the following:  A procedure to drain the cyst (aspiration). This may be done using a long needle and ultrasound. It can also be done through a laparoscopic procedure. This involves using a thin, lighted tube with a tiny camera on the end (laparoscope) inserted through a small incision.  Surgery to remove the whole cyst. This may be done using laparoscopic surgery or an open surgery involving a larger incision in the lower abdomen.  Hormone treatment or birth control pills. These methods are sometimes used to help dissolve a cyst. HOME CARE INSTRUCTIONS   Only take over-the-counter or prescription medicines as directed by your health care provider.  Follow up with your health care provider as directed.  Get regular pelvic exams and Pap tests. SEEK MEDICAL CARE IF:   Your periods are late, irregular, or painful, or they stop.  Your pelvic pain or abdominal pain does not go away.  Your abdomen becomes larger or swollen.  You have pressure on your bladder or trouble emptying your bladder completely.  You have pain during sexual intercourse.  You have feelings of fullness, pressure, or discomfort in your stomach.  You lose weight for no apparent reason.  You feel generally ill.  You become constipated.  You lose your appetite.  You develop acne.  You have an increase in body and facial hair.  You are gaining weight, without changing your exercise and eating habits.  You think you are pregnant. SEEK IMMEDIATE MEDICAL CARE IF:   You have increasing abdominal pain.  You feel sick to your stomach (nauseous), and you throw up (vomit).  You develop a fever that comes on suddenly.  You have abdominal pain during a bowel movement.  Your menstrual periods become heavier than usual. MAKE SURE YOU:  Understand these instructions.  Will watch your condition.  Will get help right away if you are not doing well or get worse. Document Released: 09/18/2005 Document  Revised: 09/23/2013 Document Reviewed: 05/26/2013 [facility] Patient Information 2015 [place], [place]. This information is not intended to replace advice given to you by your health care provider. Make sure you discuss any questions you have with your health care provider.

## 2015-01-11 NOTE — Progress Notes (Signed)
Subjective:     Patient ID: Stacy Cruz, female   DOB: 21-Jul-1991, 24 y.o.   MRN: [id number]  HPI [id number]. LMP 3/312/2016 Pt reports 4 days of pelvic pain.  She reports that the pain is constant.  She did not note pain prior to 4days ago.  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 Was prescribed Percocet which has not helped with the pain.  Pt was seen in the Ed and told that she has a Dermoid cyst but, was not told what that meant.  Past Medical History  Diagnosis Date  . BV (bacterial vaginosis)     H/O  . H/O candidiasis   . Anemia    Past Surgical History  Procedure Laterality Date  . Wisdom tooth extraction  2011     Current Outpatient Prescriptions on File Prior to Visit  Medication Sig Dispense Refill  . metoCLOPramide (REGLAN) 10 MG tablet Take 1 tablet (10 mg total) by mouth every 6 (six) hours as needed. 30 tablet 0  . oxyCODONE-acetaminophen (PERCOCET) 5-325 MG per tablet Take 1 tablet by mouth every 4 (four) hours as needed for moderate pain. 20 tablet 0   No current facility-administered medications on file prior to visit.   No Known Allergies     Review of Systems No dysuria, no urianry freq. No constipation or abnormal vaginal discharge     Objective:   Physical Exam BP 134/74 mmHg  Pulse 106  Temp(Src) 99.6 F (37.6 C) (Oral)  Ht 5\' 4"  (1.626 m)  Wt 191 lb 8 oz (86.864 kg)  BMI 32.85 kg/m2  LMP 12/31/2014  Breastfeeding? No Pt in NAD Lungs: CTA CV: RRR Abd: soft, ND. + tenderness with voluntary guarding. GU: EGBUS: no lesions Vagina: no blood in vault Cervix: no lesion; no mucopurulent d/c Uterus: small, mobile Adnexa: Palpable mass in right adnexa; tender   CBC    Component Value Date/Time   WBC 9.1 01/08/2015 2221   RBC 4.97 01/08/2015 2221   HGB 11.3* 01/08/2015 2221   HCT 37.2 01/08/2015 2221   PLT 239 01/08/2015 2221   MCV 74.8* 01/08/2015 2221   MCH 22.7* 01/08/2015 2221   MCHC 30.4 01/08/2015  2221   RDW 15.9* 01/08/2015 2221   LYMPHSABS 1.8 01/08/2015 2221   MONOABS 0.5 01/08/2015 2221   EOSABS 0.3 01/08/2015 2221   BASOSABS 0.0 01/08/2015 2221      01/09/2015 CLINICAL DATA: Mid to low abdominal pain and diarrhea since Wednesday.  EXAM: CT ABDOMEN AND PELVIS WITH CONTRAST  TECHNIQUE: Multidetector CT imaging of the abdomen and pelvis was performed using the standard protocol following bolus administration of intravenous contrast.  CONTRAST: 144mL OMNIPAQUE IOHEXOL 300 MG/ML SOLN  COMPARISON: None.  FINDINGS: Lung bases are clear.  The liver, spleen, gallbladder, pancreas, adrenal glands, kidneys, abdominal aorta, inferior vena cava, and retroperitoneal lymph nodes are unremarkable. Small accessory spleen. Stomach, small bowel, and colon are not abnormally distended. No free air or free fluid in the abdomen. Abdominal wall musculature appears intact.  Pelvis: The appendix is normal. There is a a right adnexal mass containing fat, solid components, and calcification consistent with a dermoid cyst. This measures about 6.3 x 6.4 cm in diameter. No left adnexal mass. Probable involuting cyst in the left ovary. Intrauterine device is present and localized in the lower uterine segment and endocervical region. Uterus is not enlarged. No free or loculated pelvic fluid collections. No pelvic lymphadenopathy. No  destructive bone lesions.  IMPRESSION: 6.4 cm diameter dermoid cyst in the right adnexal region. Low placement of intrauterine device. Appendix is normal. No evidence of bowel obstruction.     Assessment:     Right ovarian dermoid cyst.  Acutely painful and tender requiring surgery. Reviewed pt results of sono.  I have reviewed with pt her treatment options and the risk of subsequent bilaterality.      Plan:     Patient desires surgical management with right ovarian cystectomy.  The risks of surgery were discussed in detail with the patient  including but not limited to: bleeding which may require transfusion or reoperation; infection which may require prolonged hospitalization or re-hospitalization and antibiotic therapy; injury to bowel, bladder, ureters and major vessels or other surrounding organs; need for additional procedures including laparotomy; thromboembolic phenomenon, incisional problems and other postoperative or anesthesia complications.  Patient was told that the likelihood that her condition and symptoms will be treated effectively with this surgical management was very high; the postoperative expectations were also discussed in detail. The patient also understands the alternative treatment options which were discussed in full. All questions were answered.  She was told that she will be contacted by our surgical scheduler regarding the time and date of her surgery; routine preoperative instructions of having nothing to eat or drink after midnight on the day prior to surgery and also coming to the hospital 1 1/2 hours prior to her time of surgery were also emphasized.  She was told she may be called for a preoperative appointment about a week prior to surgery and will be given further preoperative instructions at that visit. Printed patient education handouts about the procedure were given to the patient to review at home.

## 2015-01-12 ENCOUNTER — Ambulatory Visit (HOSPITAL_COMMUNITY): Payer: Medicaid Other | Admitting: Anesthesiology

## 2015-01-12 ENCOUNTER — Encounter (HOSPITAL_COMMUNITY): Payer: Self-pay | Admitting: *Deleted

## 2015-01-12 ENCOUNTER — Encounter (HOSPITAL_COMMUNITY)
Admission: RE | Disposition: A | Discharge status: Home / Self Care | Payer: Self-pay | Source: Ambulatory Visit | Attending: Obstetrics & Gynecology

## 2015-01-12 ENCOUNTER — Ambulatory Visit (HOSPITAL_COMMUNITY)
Admission: RE | Admit: 2015-01-12 | Discharge: 2015-01-12 | Disposition: A | Discharge status: Home / Self Care | Payer: Medicaid Other | Source: Ambulatory Visit | Attending: Obstetrics & Gynecology | Admitting: Obstetrics & Gynecology

## 2015-01-12 DIAGNOSIS — N730 Acute parametritis and pelvic cellulitis: Secondary | ICD-10-CM

## 2015-01-12 DIAGNOSIS — N8320 Unspecified ovarian cysts: Secondary | ICD-10-CM

## 2015-01-12 DIAGNOSIS — D270 Benign neoplasm of right ovary: Secondary | ICD-10-CM

## 2015-01-12 DIAGNOSIS — N736 Female pelvic peritoneal adhesions (postinfective): Secondary | ICD-10-CM | POA: Diagnosis not present

## 2015-01-12 DIAGNOSIS — D649 Anemia, unspecified: Secondary | ICD-10-CM | POA: Insufficient documentation

## 2015-01-12 DIAGNOSIS — D271 Benign neoplasm of left ovary: Secondary | ICD-10-CM | POA: Diagnosis present

## 2015-01-12 DIAGNOSIS — A549 Gonococcal infection, unspecified: Secondary | ICD-10-CM

## 2015-01-12 HISTORY — PX: LAPAROSCOPIC OVARIAN CYSTECTOMY: SHX6248

## 2015-01-12 LAB — HIV ANTIBODY (ROUTINE TESTING W REFLEX): HIV Screen 4th Generation wRfx: NONREACTIVE

## 2015-01-12 LAB — PREGNANCY, URINE: Preg Test, Ur: NEGATIVE

## 2015-01-12 SURGERY — EXCISION, CYST, OVARY, LAPAROSCOPIC
Anesthesia: General | Laterality: Left

## 2015-01-12 MED ORDER — CEFTRIAXONE SODIUM 250 MG IJ SOLR
250.0000 mg | Freq: Once | INTRAMUSCULAR | Status: AC
  Administered 2015-01-12: 250 mg via INTRAMUSCULAR
  Filled 2015-01-12: qty 250

## 2015-01-12 MED ORDER — ACETAMINOPHEN 160 MG/5ML PO SOLN
ORAL | Status: AC
  Administered 2015-01-12: 975 mg via ORAL
  Filled 2015-01-12: qty 40.6

## 2015-01-12 MED ORDER — SCOPOLAMINE 1 MG/3DAYS TD PT72
1.0000 | MEDICATED_PATCH | Freq: Once | TRANSDERMAL | Status: DC
  Administered 2015-01-12: 1.5 mg via TRANSDERMAL

## 2015-01-12 MED ORDER — MIDAZOLAM HCL 2 MG/2ML IJ SOLN
INTRAMUSCULAR | Status: AC
  Filled 2015-01-12: qty 2

## 2015-01-12 MED ORDER — FENTANYL CITRATE 0.05 MG/ML IJ SOLN
INTRAMUSCULAR | Status: DC | PRN
  Administered 2015-01-12 (×5): 50 ug via INTRAVENOUS

## 2015-01-12 MED ORDER — FENTANYL CITRATE 0.05 MG/ML IJ SOLN
INTRAMUSCULAR | Status: AC
  Administered 2015-01-12: 50 ug via INTRAVENOUS
  Filled 2015-01-12: qty 2

## 2015-01-12 MED ORDER — LIDOCAINE HCL (CARDIAC) 20 MG/ML IV SOLN
INTRAVENOUS | Status: DC | PRN
  Administered 2015-01-12: 70 mg via INTRAVENOUS
  Administered 2015-01-12: 30 mg via INTRAVENOUS

## 2015-01-12 MED ORDER — ONDANSETRON HCL 4 MG/2ML IJ SOLN
INTRAMUSCULAR | Status: AC
  Filled 2015-01-12: qty 2

## 2015-01-12 MED ORDER — LACTATED RINGERS IV SOLN
INTRAVENOUS | Status: DC
  Administered 2015-01-12: 11:00:00 via INTRAVENOUS

## 2015-01-12 MED ORDER — LIDOCAINE HCL (CARDIAC) 20 MG/ML IV SOLN
INTRAVENOUS | Status: AC
  Filled 2015-01-12: qty 5

## 2015-01-12 MED ORDER — FENTANYL CITRATE 0.05 MG/ML IJ SOLN
INTRAMUSCULAR | Status: AC
  Filled 2015-01-12: qty 5

## 2015-01-12 MED ORDER — ROCURONIUM BROMIDE 100 MG/10ML IV SOLN
INTRAVENOUS | Status: AC
  Filled 2015-01-12: qty 1

## 2015-01-12 MED ORDER — OXYCODONE-ACETAMINOPHEN 5-325 MG PO TABS
1.0000 | ORAL_TABLET | Freq: Once | ORAL | Status: AC
Start: 2015-01-12 — End: 2015-01-12
  Administered 2015-01-12: 1 via ORAL

## 2015-01-12 MED ORDER — GLYCOPYRROLATE 0.2 MG/ML IJ SOLN
INTRAMUSCULAR | Status: AC
  Filled 2015-01-12: qty 3

## 2015-01-12 MED ORDER — MIDAZOLAM HCL 2 MG/2ML IJ SOLN
INTRAMUSCULAR | Status: DC | PRN
  Administered 2015-01-12: 2 mg via INTRAVENOUS

## 2015-01-12 MED ORDER — NEOSTIGMINE METHYLSULFATE 10 MG/10ML IV SOLN
INTRAVENOUS | Status: DC | PRN
  Administered 2015-01-12: 4 mg via INTRAVENOUS

## 2015-01-12 MED ORDER — KETOROLAC TROMETHAMINE 30 MG/ML IJ SOLN
INTRAMUSCULAR | Status: DC | PRN
  Administered 2015-01-12: 30 mg via INTRAVENOUS

## 2015-01-12 MED ORDER — DEXAMETHASONE SODIUM PHOSPHATE 10 MG/ML IJ SOLN
INTRAMUSCULAR | Status: DC | PRN
  Administered 2015-01-12: 4 mg via INTRAVENOUS

## 2015-01-12 MED ORDER — LACTATED RINGERS IV SOLN
INTRAVENOUS | Status: DC
  Administered 2015-01-12 (×3): via INTRAVENOUS

## 2015-01-12 MED ORDER — FENTANYL CITRATE 0.05 MG/ML IJ SOLN
25.0000 ug | INTRAMUSCULAR | Status: DC | PRN
  Administered 2015-01-12 (×2): 50 ug via INTRAVENOUS

## 2015-01-12 MED ORDER — NEOSTIGMINE METHYLSULFATE 10 MG/10ML IV SOLN
INTRAVENOUS | Status: AC
  Filled 2015-01-12: qty 1

## 2015-01-12 MED ORDER — BUPIVACAINE HCL (PF) 0.5 % IJ SOLN
INTRAMUSCULAR | Status: AC
  Filled 2015-01-12: qty 30

## 2015-01-12 MED ORDER — 0.9 % SODIUM CHLORIDE (POUR BTL) OPTIME
TOPICAL | Status: DC | PRN
  Administered 2015-01-12: 1000 mL

## 2015-01-12 MED ORDER — GLYCOPYRROLATE 0.2 MG/ML IJ SOLN
INTRAMUSCULAR | Status: DC | PRN
  Administered 2015-01-12: 0.6 mg via INTRAVENOUS

## 2015-01-12 MED ORDER — IBUPROFEN 600 MG PO TABS: 600.0000 mg | ORAL_TABLET | Freq: Four times a day (QID) | ORAL | Status: DC | PRN

## 2015-01-12 MED ORDER — SCOPOLAMINE 1 MG/3DAYS TD PT72
MEDICATED_PATCH | TRANSDERMAL | Status: AC
  Administered 2015-01-12: 1.5 mg via TRANSDERMAL
  Filled 2015-01-12: qty 1

## 2015-01-12 MED ORDER — ACETAMINOPHEN 160 MG/5ML PO SOLN
975.0000 mg | Freq: Once | ORAL | Status: AC
  Administered 2015-01-12: 975 mg via ORAL

## 2015-01-12 MED ORDER — OXYCODONE-ACETAMINOPHEN 5-325 MG PO TABS
ORAL_TABLET | ORAL | Status: AC
  Filled 2015-01-12: qty 1

## 2015-01-12 MED ORDER — OXYCODONE-ACETAMINOPHEN 5-325 MG PO TABS: 1.0000 | ORAL_TABLET | ORAL | Status: DC | PRN

## 2015-01-12 MED ORDER — BUPIVACAINE HCL (PF) 0.5 % IJ SOLN
INTRAMUSCULAR | Status: DC | PRN
  Administered 2015-01-12: 25 mL

## 2015-01-12 MED ORDER — AZITHROMYCIN 250 MG PO TABS
1000.0000 mg | ORAL_TABLET | Freq: Once | ORAL | Status: AC
  Administered 2015-01-12: 1000 mg via ORAL

## 2015-01-12 MED ORDER — ONDANSETRON HCL 4 MG/2ML IJ SOLN
INTRAMUSCULAR | Status: DC | PRN
  Administered 2015-01-12: 4 mg via INTRAVENOUS

## 2015-01-12 MED ORDER — DEXAMETHASONE SODIUM PHOSPHATE 4 MG/ML IJ SOLN
INTRAMUSCULAR | Status: AC
  Filled 2015-01-12: qty 1

## 2015-01-12 MED ORDER — PROPOFOL 10 MG/ML IV BOLUS
INTRAVENOUS | Status: AC
  Filled 2015-01-12: qty 20

## 2015-01-12 MED ORDER — LACTATED RINGERS IR SOLN
Status: DC | PRN
  Administered 2015-01-12: 3000 mL

## 2015-01-12 MED ORDER — KETOROLAC TROMETHAMINE 30 MG/ML IJ SOLN
INTRAMUSCULAR | Status: AC
  Filled 2015-01-12: qty 1

## 2015-01-12 MED ORDER — DEXTROSE 5 % IV SOLN
250.0000 mg | Freq: Once | INTRAVENOUS | Status: DC
  Filled 2015-01-12: qty 250

## 2015-01-12 MED ORDER — AZITHROMYCIN 250 MG PO TABS
ORAL_TABLET | ORAL | Status: AC
Start: 2015-01-12 — End: 2015-01-12
  Administered 2015-01-12: 1000 mg via ORAL
  Filled 2015-01-12: qty 4

## 2015-01-12 MED ORDER — ROCURONIUM BROMIDE 100 MG/10ML IV SOLN
INTRAVENOUS | Status: DC | PRN
  Administered 2015-01-12: 30 mg via INTRAVENOUS

## 2015-01-12 MED ORDER — PROPOFOL 10 MG/ML IV BOLUS
INTRAVENOUS | Status: DC | PRN
  Administered 2015-01-12: 180 mg via INTRAVENOUS

## 2015-01-12 SURGICAL SUPPLY — 32 items
BENZOIN TINCTURE PRP APPL 2/3 (GAUZE/BANDAGES/DRESSINGS) ×2 IMPLANT
CABLE HIGH FREQUENCY MONO STRZ (ELECTRODE) IMPLANT
CATH ROBINSON RED A/P 16FR (CATHETERS) IMPLANT
CHLORAPREP W/TINT 26ML (MISCELLANEOUS) ×2 IMPLANT
CLOTH BEACON ORANGE TIMEOUT ST (SAFETY) ×2 IMPLANT
DRSG COVADERM PLUS 2X2 (GAUZE/BANDAGES/DRESSINGS) ×2 IMPLANT
DRSG OPSITE POSTOP 3X4 (GAUZE/BANDAGES/DRESSINGS) ×2 IMPLANT
GAUZE SPONGE 4X4 16PLY XRAY LF (GAUZE/BANDAGES/DRESSINGS) ×2 IMPLANT
GLOVE BIO SURGEON STRL SZ7 (GLOVE) ×6 IMPLANT
GLOVE BIOGEL PI IND STRL 7.0 (GLOVE) ×3 IMPLANT
GLOVE BIOGEL PI INDICATOR 7.0 (GLOVE) ×3
GOWN STRL REUS W/TWL LRG LVL3 (GOWN DISPOSABLE) ×8 IMPLANT
LIQUID BAND (GAUZE/BANDAGES/DRESSINGS) ×4 IMPLANT
MANIPULATOR UTERINE 4.5 ZUMI (MISCELLANEOUS) ×2 IMPLANT
NEEDLE INSUFFLATION 120MM (ENDOMECHANICALS) ×2 IMPLANT
NS IRRIG 1000ML POUR BTL (IV SOLUTION) ×2 IMPLANT
PACK LAPAROSCOPY BASIN (CUSTOM PROCEDURE TRAY) ×2 IMPLANT
PAD POSITIONER PINK NONSTERILE (MISCELLANEOUS) ×2 IMPLANT
POUCH SPECIMEN RETRIEVAL 10MM (ENDOMECHANICALS) ×2 IMPLANT
PROTECTOR NERVE ULNAR (MISCELLANEOUS) ×2 IMPLANT
SET IRRIG TUBING LAPAROSCOPIC (IRRIGATION / IRRIGATOR) ×4 IMPLANT
SHEARS HARMONIC ACE PLUS 36CM (ENDOMECHANICALS) ×2 IMPLANT
SLEEVE XCEL OPT CAN 5 100 (ENDOMECHANICALS) ×2 IMPLANT
STRIP CLOSURE SKIN 1/2X4 (GAUZE/BANDAGES/DRESSINGS) ×2 IMPLANT
SUT VIC AB 3-0 X1 27 (SUTURE) ×2 IMPLANT
SUT VICRYL 0 UR6 27IN ABS (SUTURE) ×4 IMPLANT
SUT VICRYL 4-0 PS2 18IN ABS (SUTURE) ×2 IMPLANT
TOWEL OR 17X24 6PK STRL BLUE (TOWEL DISPOSABLE) ×4 IMPLANT
TRAY FOLEY CATH SILVER 14FR (SET/KITS/TRAYS/PACK) ×2 IMPLANT
TROCAR OPTI TIP 5M 100M (ENDOMECHANICALS) ×2 IMPLANT
TROCAR XCEL DIL TIP R 11M (ENDOMECHANICALS) ×2 IMPLANT
WATER STERILE IRR 1000ML POUR (IV SOLUTION) ×2 IMPLANT

## 2015-01-12 NOTE — Anesthesia Procedure Notes (Signed)
Procedure Name: Intubation Date/Time: 01/12/2015 12:09 PM Performed by: Tobin Chad Pre-anesthesia Checklist: Patient identified, Timeout performed, Emergency Drugs available, Suction available and Patient being monitored Patient Re-evaluated:Patient Re-evaluated prior to inductionOxygen Delivery Method: Circle system utilized and Simple face mask Preoxygenation: Pre-oxygenation with 100% oxygen Intubation Type: IV induction Ventilation: Mask ventilation without difficulty Laryngoscope Size: Mac and 3 Grade View: Grade II Tube type: Oral Tube size: 7.0 mm Number of attempts: 1 Airway Equipment and Method: Rigid stylet Placement Confirmation: ETT inserted through vocal cords under direct vision,  breath sounds checked- equal and bilateral and positive ETCO2 Secured at: 22 cm Tube secured with: Tape Dental Injury: Teeth and Oropharynx as per pre-operative assessment

## 2015-01-12 NOTE — Discharge Instructions (Signed)
Unilateral Salpingo-Oophorectomy, Care After Refer to this sheet in the next few weeks. These instructions provide you with information on caring for yourself after your procedure. Your health care provider may also give you more specific instructions. Your treatment has been planned according to current medical practices, but problems sometimes occur. Call your health care provider if you have any problems or questions after your procedure. WHAT TO EXPECT AFTER THE PROCEDURE After your procedure, it is typical to have the following:  Abdominal pain that can be controlled with pain medicine.  Vaginal spotting.  Constipation. HOME CARE INSTRUCTIONS   Get plenty of rest and sleep.  Only take over-the-counter or prescription medicines as directed by your health care provider. Do not take aspirin. It can cause bleeding.  Keep incision areas clean and dry. Remove or change any bandages (dressings) only as directed by your health care provider.  Follow your health care provider's advice regarding diet.  Drink enough fluids to keep your urine clear or pale yellow.  Limit exercise and activities as directed by your health care provider. Do not lift anything heavier than 5 pounds (2.3 kg) until your health care provider approves.  Do not drive until your health care provider approves.  Do not drink alcohol until your health care provider approves.  Do not have sexual intercourse until your health care provider says it is OK.  Take your temperature twice a day and write it down.  If you become constipated, you may:  Ask your health care provider about taking a mild laxative.  Add more fruit and bran to your diet.  Drink more fluids.  Follow up with your health care provider as directed. SEEK MEDICAL CARE IF:   You have swelling or redness in the incision area.  You develop a rash.  You feel lightheaded.  You have pain that is not controlled with medicine.  You have pain,  swelling, or redness where the IV access tube was placed. SEEK IMMEDIATE MEDICAL CARE IF:  You have a fever.  You develop increasing abdominal pain.  You see pus coming out of the incision, or the incision is separating.  You notice a bad smell coming from the wound or dressing.  You have excessive vaginal bleeding.  You feel sick to your stomach (nauseous) and vomit.  You have leg or chest pain.  You have pain when you urinate.  You develop shortness of breath.  You pass out. Document Released: 07/15/2009 Document Revised: 07/09/2013 Document Reviewed: 03/12/2013 [facility] Patient Information 2015 [place], [place]. This information is not intended to replace advice given to you by your health care provider. Make sure you discuss any questions you have with your health care provider. Ovarian Cyst An ovarian cyst is a fluid-filled sac that forms on an ovary. The ovaries are small organs that produce eggs in women. Various types of cysts can form on the ovaries. Most are not cancerous. Many do not cause problems, and they often go away on their own. Some may cause symptoms and require treatment. Common types of ovarian cysts include:  Functional cysts--These cysts may occur every month during the menstrual cycle. This is normal. The cysts usually go away with the next menstrual cycle if the woman does not get pregnant. Usually, there are no symptoms with a functional cyst.  Endometrioma cysts--These cysts form from the tissue that lines the uterus. They are also called "chocolate cysts" because they become filled with blood that turns brown. This type of cyst can cause  pain in the lower abdomen during intercourse and with your menstrual period.  Cystadenoma cysts--This type develops from the cells on the outside of the ovary. These cysts can get very big and cause lower abdomen pain and pain with intercourse. This type of cyst can twist on itself, cut off its blood supply, and cause severe  pain. It can also easily rupture and cause a lot of pain.  Dermoid cysts--This type of cyst is sometimes found in both ovaries. These cysts may contain different kinds of body tissue, such as skin, teeth, hair, or cartilage. They usually do not cause symptoms unless they get very big.  Theca lutein cysts--These cysts occur when too much of a certain hormone (human chorionic gonadotropin) is produced and overstimulates the ovaries to produce an egg. This is most common after procedures used to assist with the conception of a baby (in vitro fertilization). CAUSES   Fertility drugs can cause a condition in which multiple large cysts are formed on the ovaries. This is called ovarian hyperstimulation syndrome.  A condition called polycystic ovary syndrome can cause hormonal imbalances that can lead to nonfunctional ovarian cysts. SIGNS AND SYMPTOMS  Many ovarian cysts do not cause symptoms. If symptoms are present, they may include:  Pelvic pain or pressure.  Pain in the lower abdomen.  Pain during sexual intercourse.  Increasing girth (swelling) of the abdomen.  Abnormal menstrual periods.  Increasing pain with menstrual periods.  Stopping having menstrual periods without being pregnant. DIAGNOSIS  These cysts are commonly found during a routine or annual pelvic exam. Tests may be ordered to find out more about the cyst. These tests may include:  Ultrasound.  X-ray of the pelvis.  CT scan.  MRI.  Blood tests. TREATMENT  Many ovarian cysts go away on their own without treatment. Your health care provider may want to check your cyst regularly for 2-3 months to see if it changes. For women in menopause, it is particularly important to monitor a cyst closely because of the higher rate of ovarian cancer in menopausal women. When treatment is needed, it may include any of the following:  A procedure to drain the cyst (aspiration). This may be done using a long needle and ultrasound. It  can also be done through a laparoscopic procedure. This involves using a thin, lighted tube with a tiny camera on the end (laparoscope) inserted through a small incision.  Surgery to remove the whole cyst. This may be done using laparoscopic surgery or an open surgery involving a larger incision in the lower abdomen.  Hormone treatment or birth control pills. These methods are sometimes used to help dissolve a cyst. HOME CARE INSTRUCTIONS   Only take over-the-counter or prescription medicines as directed by your health care provider.  Follow up with your health care provider as directed.  Get regular pelvic exams and Pap tests. SEEK MEDICAL CARE IF:   Your periods are late, irregular, or painful, or they stop.  Your pelvic pain or abdominal pain does not go away.  Your abdomen becomes larger or swollen.  You have pressure on your bladder or trouble emptying your bladder completely.  You have pain during sexual intercourse.  You have feelings of fullness, pressure, or discomfort in your stomach.  You lose weight for no apparent reason.  You feel generally ill.  You become constipated.  You lose your appetite.  You develop acne.  You have an increase in body and facial hair.  You are gaining weight, without  changing your exercise and eating habits.  You think you are pregnant. SEEK IMMEDIATE MEDICAL CARE IF:   You have increasing abdominal pain.  You feel sick to your stomach (nauseous), and you throw up (vomit).  You develop a fever that comes on suddenly.  You have abdominal pain during a bowel movement.  Your menstrual periods become heavier than usual. MAKE SURE YOU:  Understand these instructions.  Will watch your condition.  Will get help right away if you are not doing well or get worse. Document Released: 09/18/2005 Document Revised: 09/23/2013 Document Reviewed: 05/26/2013 [facility] Patient Information 2015 [place], [place]. This information is not  intended to replace advice given to you by your health care provider. Make sure you discuss any questions you have with your health care provider. Pelvic Inflammatory Disease Pelvic inflammatory disease (PID) refers to an infection in some or all of the female organs. The infection can be in the uterus, ovaries, fallopian tubes, or the surrounding tissues in the pelvis. PID can cause abdominal or pelvic pain that comes on suddenly (acute pelvic pain). PID is a serious infection because it can lead to lasting (chronic) pelvic pain or the inability to have children (infertile).  CAUSES  The infection is often caused by the normal bacteria found in the vaginal tissues. PID may also be caused by an infection that is spread during sexual contact. PID can also occur following:   The birth of a baby.   A miscarriage.   An abortion.   Major pelvic surgery.   The use of an intrauterine device (IUD).   A sexual assault.  RISK FACTORS Certain factors can put a person at higher risk for PID, such as:  Being younger than 25 years.  Being sexually active at [place] age.  Usingnonbarrier contraception.  Havingmultiple sexual partners.  Having sex with someone who has symptoms of a genital infection.  Using oral contraception. Other times, certain behaviors can increase the possibility of getting PID, such as:  Having sex during your period.  Using a vaginal douche.  Having an intrauterine device (IUD) in place. SYMPTOMS   Abdominal or pelvic pain.   Fever.   Chills.   Abnormal vaginal discharge.  Abnormal uterine bleeding.   Unusual pain shortly after finishing your period. DIAGNOSIS  Your caregiver will choose some of the following methods to make a diagnosis, such as:   Performinga physical exam and history. A pelvic exam typically reveals a very tender uterus and surrounding pelvis.   Ordering laboratory tests including a pregnancy test, blood tests, and  urine test.  Orderingcultures of the vagina and cervix to check for a sexually transmitted infection (STI).  Performing an ultrasound.   Performing a laparoscopic procedure to look inside the pelvis.  TREATMENT   Antibiotic medicines may be prescribed and taken by mouth.   Sexual partners may be treated when the infection is caused by a sexually transmitted disease (STD).   Hospitalization may be needed to give antibiotics intravenously.  Surgery may be needed, but this is rare. It may take weeks until you are completely well. If you are diagnosed with PID, you should also be checked for human immunodeficiency virus (HIV). HOME CARE INSTRUCTIONS   If given, take your antibiotics as directed. Finish the medicine even if you start to feel better.   Only take over-the-counter or prescription medicines for pain, discomfort, or fever as directed by your caregiver.   Do not have sexual intercourse until treatment is completed or as  directed by your caregiver. If PID is confirmed, your recent sexual partner(s) will need treatment.   Keep your follow-up appointments. SEEK MEDICAL CARE IF:   You have increased or abnormal vaginal discharge.   You need prescription medicine for your pain.   You vomit.   You cannot take your medicines.   Your partner has an STD.  SEEK IMMEDIATE MEDICAL CARE IF:   You have a fever.   You have increased abdominal or pelvic pain.   You have chills.   You have pain when you urinate.   You are not better after 72 hours following treatment.  MAKE SURE YOU:   Understand these instructions.  Will watch your condition.  Will get help right away if you are not doing well or get worse. Document Released: 09/18/2005 Document Revised: 01/13/2013 Document Reviewed: 09/14/2011 [facility] Patient Information 2015 [place], [place]. This information is not intended to replace advice given to you by your health care provider. Make sure you  discuss any questions you have with your health care provider.

## 2015-01-12 NOTE — Transfer of Care (Signed)
Immediate Anesthesia Transfer of Care Note  Patient: Stacy Cruz  Procedure(s) Performed: Procedure(s) with comments: LAPAROSCOPIC OVARIAN CYSTECTOMY (Left) - Dermoid cyst  Patient Location: PACU  Anesthesia Type:General  Level of Consciousness: awake, oriented, sedated and patient cooperative  Airway & Oxygen Therapy: Patient Spontanous Breathing and Patient connected to nasal cannula oxygen  Post-op Assessment: Report given to RN and Post -op Vital signs reviewed and stable  Post vital signs: Reviewed and stable  Last Vitals:  Filed Vitals:   01/12/15 1019  BP: 128/74  Pulse: 85  Temp: 37 C    Complications: No apparent anesthesia complications

## 2015-01-12 NOTE — Brief Op Note (Signed)
01/12/2015  1:23 PM  PATIENT:  Stacy Cruz  24 y.o. female  PRE-OPERATIVE DIAGNOSIS:  dermoid cyst   POST-OPERATIVE DIAGNOSIS:  dermoid cyst; pelvic inflammatory disease   PROCEDURE:  Procedure(s) with comments: LAPAROSCOPIC OVARIAN CYSTECTOMY (Left) - Dermoid cyst  SURGEON:  Surgeon(s) and Role:    * Lavonia Drafts, MD - Primary  ASSISTANTS: Clovia Cuff, M.D.   ANESTHESIA:   general  EBL:  Total I/O In: 800 [I.V.:800] Out: -   BLOOD ADMINISTERED:none  DRAINS: none   LOCAL MEDICATIONS USED:  MARCAINE     SPECIMEN:  Source of Specimen:  right ovary with dermoid  DISPOSITION OF SPECIMEN:  PATHOLOGY  COUNTS:  YES  TOURNIQUET:  * No tourniquets in log *  DICTATION: .Note written in EPIC  PLAN OF CARE: Discharge to home after PACU  PATIENT DISPOSITION:  PACU - hemodynamically stable.   Delay start of Pharmacological VTE agent (>24hrs) due to surgical blood loss or risk of bleeding: not applicable

## 2015-01-12 NOTE — H&P (View-Only) (Signed)
Subjective:     Patient ID: Stacy Cruz, female   DOB: [birth year], 24 y.o.   MRN: [id number]  HPI [id number]. LMP 3/312/2016 Pt reports 4 days of pelvic pain.  She reports that the pain is constant.  She did not note pain prior to 4days ago.  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 Was prescribed Percocet which has not helped with the pain.  Pt was seen in the Ed and told that she has a Dermoid cyst but, was not told what that meant.  Past Medical History  Diagnosis Date  . BV (bacterial vaginosis)     H/O  . H/O candidiasis   . Anemia    Past Surgical History  Procedure Laterality Date  . Wisdom tooth extraction  2011     Current Outpatient Prescriptions on File Prior to Visit  Medication Sig Dispense Refill  . metoCLOPramide (REGLAN) 10 MG tablet Take 1 tablet (10 mg total) by mouth every 6 (six) hours as needed. 30 tablet 0  . oxyCODONE-acetaminophen (PERCOCET) 5-325 MG per tablet Take 1 tablet by mouth every 4 (four) hours as needed for moderate pain. 20 tablet 0   No current facility-administered medications on file prior to visit.   No Known Allergies     Review of Systems No dysuria, no urianry freq. No constipation or abnormal vaginal discharge     Objective:   Physical Exam BP 134/74 mmHg  Pulse 106  Temp(Src) 99.6 F (37.6 C) (Oral)  Ht 5\' 4"  (1.626 m)  Wt 191 lb 8 oz (86.864 kg)  BMI 32.85 kg/m2  LMP 12/31/2014  Breastfeeding? No Pt in NAD Lungs: CTA CV: RRR Abd: soft, ND. + tenderness with voluntary guarding. GU: EGBUS: no lesions Vagina: no blood in vault Cervix: no lesion; no mucopurulent d/c Uterus: small, mobile Adnexa: Palpable mass in right adnexa; tender   CBC    Component Value Date/Time   WBC 9.1 01/08/2015 2221   RBC 4.97 01/08/2015 2221   HGB 11.3* 01/08/2015 2221   HCT 37.2 01/08/2015 2221   PLT 239 01/08/2015 2221   MCV 74.8* 01/08/2015 2221   MCH 22.7* 01/08/2015 2221   MCHC 30.4 01/08/2015  2221   RDW 15.9* 01/08/2015 2221   LYMPHSABS 1.8 01/08/2015 2221   MONOABS 0.5 01/08/2015 2221   EOSABS 0.3 01/08/2015 2221   BASOSABS 0.0 01/08/2015 2221      01/09/2015 CLINICAL DATA: Mid to low abdominal pain and diarrhea since Wednesday.  EXAM: CT ABDOMEN AND PELVIS WITH CONTRAST  TECHNIQUE: Multidetector CT imaging of the abdomen and pelvis was performed using the standard protocol following bolus administration of intravenous contrast.  CONTRAST: 136mL OMNIPAQUE IOHEXOL 300 MG/ML SOLN  COMPARISON: None.  FINDINGS: Lung bases are clear.  The liver, spleen, gallbladder, pancreas, adrenal glands, kidneys, abdominal aorta, inferior vena cava, and retroperitoneal lymph nodes are unremarkable. Small accessory spleen. Stomach, small bowel, and colon are not abnormally distended. No free air or free fluid in the abdomen. Abdominal wall musculature appears intact.  Pelvis: The appendix is normal. There is a a right adnexal mass containing fat, solid components, and calcification consistent with a dermoid cyst. This measures about 6.3 x 6.4 cm in diameter. No left adnexal mass. Probable involuting cyst in the left ovary. Intrauterine device is present and localized in the lower uterine segment and endocervical region. Uterus is not enlarged. No free or loculated pelvic fluid collections. No pelvic lymphadenopathy. No  destructive bone lesions.  IMPRESSION: 6.4 cm diameter dermoid cyst in the right adnexal region. Low placement of intrauterine device. Appendix is normal. No evidence of bowel obstruction.     Assessment:     Right ovarian dermoid cyst.  Acutely painful and tender requiring surgery. Reviewed pt results of sono.  I have reviewed with pt her treatment options and the risk of subsequent bilaterality.      Plan:     Patient desires surgical management with right ovarian cystectomy.  The risks of surgery were discussed in detail with the patient  including but not limited to: bleeding which may require transfusion or reoperation; infection which may require prolonged hospitalization or re-hospitalization and antibiotic therapy; injury to bowel, bladder, ureters and major vessels or other surrounding organs; need for additional procedures including laparotomy; thromboembolic phenomenon, incisional problems and other postoperative or anesthesia complications.  Patient was told that the likelihood that her condition and symptoms will be treated effectively with this surgical management was very high; the postoperative expectations were also discussed in detail. The patient also understands the alternative treatment options which were discussed in full. All questions were answered.  She was told that she will be contacted by our surgical scheduler regarding the time and date of her surgery; routine preoperative instructions of having nothing to eat or drink after midnight on the day prior to surgery and also coming to the hospital 1 1/2 hours prior to her time of surgery were also emphasized.  She was told she may be called for a preoperative appointment about a week prior to surgery and will be given further preoperative instructions at that visit. Printed patient education handouts about the procedure were given to the patient to review at home.

## 2015-01-12 NOTE — Anesthesia Postprocedure Evaluation (Signed)
Anesthesia Post Note  Patient: Stacy Cruz  Procedure(s) Performed: Procedure(s) (LRB): LAPAROSCOPIC OVARIAN CYSTECTOMY (Left)  Anesthesia type: General  Patient location: PACU  Post pain: Pain level controlled  Post assessment: Post-op Vital signs reviewed  Last Vitals:  Filed Vitals:   01/12/15 1430  BP:   Pulse: 67  Temp:   Resp: 15    Post vital signs: Reviewed  Level of consciousness: sedated  Complications: No apparent anesthesia complications

## 2015-01-12 NOTE — Anesthesia Preprocedure Evaluation (Signed)
Anesthesia Evaluation  Patient identified by MRN, date of birth, ID band Patient awake    Reviewed: Allergy & Precautions, H&P , Patient's Chart, lab work & pertinent test results, reviewed documented beta blocker date and time   Airway Mallampati: II  TM Distance: >3 FB Neck ROM: full    Dental no notable dental hx.    Pulmonary  breath sounds clear to auscultation  Pulmonary exam normal       Cardiovascular Rhythm:regular Rate:Normal     Neuro/Psych    GI/Hepatic   Endo/Other    Renal/GU      Musculoskeletal   Abdominal   Peds  Hematology   Anesthesia Other Findings   Reproductive/Obstetrics                             Anesthesia Physical Anesthesia Plan  ASA: II  Anesthesia Plan: General   Post-op Pain Management:    Induction: Intravenous  Airway Management Planned: Oral ETT  Additional Equipment:   Intra-op Plan:   Post-operative Plan: Extubation in OR  Informed Consent: I have reviewed the patients History and Physical, chart, labs and discussed the procedure including the risks, benefits and alternatives for the proposed anesthesia with the patient or authorized representative who has indicated his/her understanding and acceptance.   Dental Advisory Given and Dental advisory given  Plan Discussed with: CRNA and Surgeon  Anesthesia Plan Comments: (  Discussed general anesthesia, including possible nausea, instrumentation of airway, sore throat,pulmonary aspiration, etc. I asked if the were any outstanding questions, or  concerns before we proceeded. )        Anesthesia Quick Evaluation

## 2015-01-12 NOTE — Op Note (Signed)
01/12/2015  1:23 PM  PATIENT:  Stacy Cruz  24 y.o. female  PRE-OPERATIVE DIAGNOSIS:  dermoid cyst   POST-OPERATIVE DIAGNOSIS:  dermoid cyst; pelvic inflammatory disease   PROCEDURE:  Procedure(s) with comments: LAPAROSCOPIC OVARIAN CYSTECTOMY (Left) - Dermoid cyst  SURGEON:  Surgeon(s) and Role:    * Lavonia Drafts, MD - Primary  ASSISTANTS: Clovia Cuff, M.D.   ANESTHESIA:   general  EBL:  Total I/O In: 800 [I.V.:800] Out: -   BLOOD ADMINISTERED:none  DRAINS: none   LOCAL MEDICATIONS USED:  MARCAINE     SPECIMEN:  Source of Specimen:  right ovary with dermoid  DISPOSITION OF SPECIMEN:  PATHOLOGY  COUNTS:  YES  TOURNIQUET:  * No tourniquets in log *  DICTATION: .Note written in EPIC  PLAN OF CARE: Discharge to home after PACU  PATIENT DISPOSITION:  PACU - hemodynamically stable.   Delay start of Pharmacological VTE agent (>24hrs) due to surgical blood loss or risk of bleeding: not applicable  Complications: none immediate    INDICATIONS: 24 y.o. [id number] here with Right ovarian dermoid.  Please refer to preoperative notes for more details. Patient was counseled regarding need for laparoscopic ovarian cystectomy with possible right oophorectomy. Risks of surgery including bleeding which may require transfusion or reoperation, infection, injury to bowel or other surrounding organs, need for additional procedures including laparotomy and other postoperative/anesthesia complications were explained to patient.  Written informed consent was obtained.  FINDINGS:  6-8 cm right ovarian dermoid.  Filmy adhesions and swelling of fallopian tubes bilaterally. Small normal appearing uterus, normal left ovary   PROCEDURE IN DETAIL:  The patient was taken to the operating room where general anesthesia was administered and was found to be adequate.  She was placed in the dorsal lithotomy position, and was prepped and draped in a sterile manner.  A Foley catheter was  inserted into her bladder and attached to constant drainage and a uterine manipulator was then advanced into the uterus .  After an adequate timeout was performed, attention was then turned to the patient's abdomen where a 5-mm skin incision was made on the umbilical fold.  The Veress needle was carefully introduced into the peritoneal cavity at a 45-degree angle into the abdominal wall.  Intraperitoneal placement was confirmed by drop in intraabdominal pressure with insufflation of carbon dioxide gas.  Adequate pneumoperitoneum was obtained, and the 5-mm trocar and sleeve were then advanced without difficulty into the abdomen where intraabdominal placement was confirmed by the laparoscope. A survey of the patient's pelvis and abdomen revealed the findings as above.  A 37mm port was placed under direct visualization on the right side and a 40mm port was placed on the patients left side.   Attention was then turned to the right ovary which was grasped and the ovary was dissected off of the dermoid cyst.  The cyst wall was thin and densely adherent to the dermoid and therefore the uteroovarian ligament was ligated using the Harmonic scalpel.  Good hemostasis was noted.  The specimen was placed in an EndoCatch bag and brought to the skin and the ovary was opened in the bag and the contents were removed with grasping forceps and suction.  There was no spillage.  The suction irrigator was then used to irrigate the pelvis. Excellent hemostasis was noted. The abdomen was desufflated, and all instruments were removed.  All skin incisions were closed with 3-0 vicryl and Dermabond. 25cc of 0.5% Marcaine was injected into the port sites.  The  patient tolerated the procedure well.  All instruments, needles, and sponge counts were correct x 2. The patient was taken to the recovery room in stable condition.   The patient will be discharged to home as per PACU criteria.  Routine postoperative instructions given.  She was  prescribed Percocet and Ibuprofen.   Her GC PCR probe was noted to be positive so pt will be treated for [facility] and chlamydia prior to discharge. She will follow up in the clinic in about 2 weeks for postoperative evaluation.

## 2015-01-12 NOTE — Interval H&P Note (Signed)
History and Physical Interval Note:  01/12/2015 11:18 AM  Stacy Cruz  has presented today for surgery, with the diagnosis of dermoid cyst   The various methods of treatment have been discussed with the patient and family. After consideration of risks, benefits and other options for treatment, the patient has consented to  Procedure(s) with comments: LAPAROSCOPIC OVARIAN CYSTECTOMY (Left) - Dermoid cyst as a surgical intervention .  The patient's history has been reviewed, patient examined, no change in status, stable for surgery.  I have reviewed the patient's chart and labs.  Questions were answered to the patient's satisfaction.     HARRAWAY-SMITH, Ghadeer Kastelic  No change noted since yesterday.  Will proceed with laparoscopy as scheduled.  Wanette Robison L. Harraway-Smith, M.D., Cherlynn June

## 2015-01-13 ENCOUNTER — Encounter (HOSPITAL_COMMUNITY): Payer: Self-pay | Admitting: Obstetrics & Gynecology

## 2015-01-13 ENCOUNTER — Telehealth (HOSPITAL_BASED_OUTPATIENT_CLINIC_OR_DEPARTMENT_OTHER): Payer: Self-pay | Admitting: Emergency Medicine

## 2015-01-13 NOTE — ED Provider Notes (Signed)
5:38 PM  Pt positive for gonorrhea. Will treat with cefixime 400 mg by mouth 1 dose.  [place], DO 01/13/15 1738

## 2015-01-13 NOTE — Telephone Encounter (Signed)
+   GC, chart handoff to EDP

## 2015-01-14 ENCOUNTER — Telehealth: Payer: Self-pay | Admitting: *Deleted

## 2015-01-18 ENCOUNTER — Encounter: Payer: Self-pay | Admitting: Obstetrics & Gynecology

## 2015-01-22 ENCOUNTER — Encounter: Payer: Self-pay | Admitting: Obstetrics & Gynecology

## 2015-01-22 ENCOUNTER — Ambulatory Visit (INDEPENDENT_AMBULATORY_CARE_PROVIDER_SITE_OTHER): Payer: Medicaid Other | Admitting: Obstetrics & Gynecology

## 2015-01-22 VITALS — BP 120/78 | HR 80 | Temp 98.6°F | Wt 188.7 lb

## 2015-01-22 DIAGNOSIS — Z9889 Other specified postprocedural states: Secondary | ICD-10-CM

## 2015-01-22 DIAGNOSIS — B373 Candidiasis of vulva and vagina: Secondary | ICD-10-CM

## 2015-01-22 DIAGNOSIS — B3731 Acute candidiasis of vulva and vagina: Secondary | ICD-10-CM

## 2015-01-22 MED ORDER — FLUCONAZOLE 150 MG PO TABS: 150.0000 mg | ORAL_TABLET | Freq: Once | ORAL | Status: DC

## 2015-01-22 NOTE — Patient Instructions (Signed)

## 2015-01-22 NOTE — Progress Notes (Signed)
Patient ID: Stacy Cruz, female   DOB: 01-16-1991, 24 y.o.   MRN: [id number] Pt complains of itchy discharge.

## 2015-01-22 NOTE — Progress Notes (Signed)
Subjective:     Patient ID: Stacy Cruz, female   DOB: November 29, 1990, 24 y.o.   MRN: [id number]  HPI Pt presents for post op care.  She denies pain.  She reports that she is tolerating a reg diet and voiding and passing stools per normal.  She does reports that starting yesterday she has vulvar itching with passage of thick white discharge.  She was notified that she had GC from the ER PCR and that she was treated while in the hosp.  She reports that she notified her partner but, she is not aware of whether he was treated or not.     Review of Systems     Objective:   Physical Exam BP 120/78 mmHg  Pulse 80  Temp(Src) 98.6 F (37 C)  Wt 188 lb 11.2 oz (85.594 kg)  LMP 12/31/2014 Pt in NAD  01/12/2015 Diagnosis Ovary, cyst, with dermoid cyst - MATURE CYSTIC TERATOMA. - NO MALIGNANCY IDENTIFIED    Assessment:     Yeast vaginitis Post op state      Plan:     Diflucan 150 gm po x 1 F/u prn  Condoms at all times

## 2015-02-03 ENCOUNTER — Telehealth: Payer: Self-pay | Admitting: *Deleted

## 2015-02-03 NOTE — Telephone Encounter (Addendum)
Pt left message stating that she had recent surgery for dermoid cyst of Rt ovary. Now she is having swelling and pain on the Lt side. Please call back.   5/5  1015  Called pt after consult w/Dr. Roselie Awkward and discussed her concern. She endorses that she is still having discomfort on the Lt side of pelvic area today.  The problem started about 1 week ago. I stated that it is unlikely that her pain and swelling are related to her surgery from 01/12/15. I asked if she is constipated and she replied no, however further discussion revealed that she has not had a BM in several days-1 week. Her normal pattern of BM's is 1-2/Carlean Crowl. I advised pt that she may want to try a Dulcolax tablet and/or Miralax per package directions. I also explained that her pain may be from ovulation or other menstrual cycle related issues. She has not had a period since her surgery. If her pain becomes severe or she develops nausea, vomiting etc she should go to [facility]. Pt has scheduled appt w/Dr. Ihor Dow on 6/8 due to this new problem. I advised pt that if her pain and swelling have subsided, she may cancel the appt if desired. Pt then stated that she stayed out of work 2 days ago because of her discomfort and is now wondering if she went back to work too early (1 week) after surgery. She wanted to know if the doctor would write a note for her job stating that she may need to be out of work from time to time. I again stated to pt that it is highly unlikely that her current problems are related to her surgery, especially since she had post op appt with Dr. Ihor Dow and all was well at that time. 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  Pt voiced understanding of all instructions and information given.

## 2015-03-10 ENCOUNTER — Ambulatory Visit: Payer: Medicaid Other | Admitting: Obstetrics & Gynecology

## 2016-06-14 ENCOUNTER — Emergency Department (HOSPITAL_COMMUNITY)
Admission: EM | Admit: 2016-06-14 | Discharge: 2016-06-14 | Disposition: A | Discharge status: Home / Self Care | Payer: Medicaid Other | Attending: Emergency Medicine | Admitting: Emergency Medicine

## 2016-06-14 ENCOUNTER — Encounter (HOSPITAL_COMMUNITY): Payer: Self-pay

## 2016-06-14 DIAGNOSIS — K047 Periapical abscess without sinus: Secondary | ICD-10-CM

## 2016-06-14 MED ORDER — CIPROFLOXACIN-DEXAMETHASONE 0.3-0.1 % OT SUSP: 4.0000 [drp] | Freq: Two times a day (BID) | OTIC | 0 refills | Status: AC

## 2016-06-14 MED ORDER — ONDANSETRON 4 MG PO TBDP
8.0000 mg | ORAL_TABLET | Freq: Once | ORAL | Status: AC
  Administered 2016-06-14: 8 mg via ORAL
  Filled 2016-06-14: qty 2

## 2016-06-14 MED ORDER — AMOXICILLIN 500 MG PO CAPS: 500.0000 mg | ORAL_CAPSULE | Freq: Three times a day (TID) | ORAL | 0 refills | Status: DC

## 2016-06-14 MED ORDER — HYDROCODONE-ACETAMINOPHEN 5-325 MG PO TABS
1.0000 | ORAL_TABLET | Freq: Once | ORAL | Status: AC
  Administered 2016-06-14: 1 via ORAL
  Filled 2016-06-14: qty 1

## 2016-06-14 MED ORDER — HYDROCODONE-ACETAMINOPHEN 5-325 MG PO TABS: 1.0000 | ORAL_TABLET | Freq: Four times a day (QID) | ORAL | 0 refills | Status: DC | PRN

## 2016-06-14 NOTE — Discharge Instructions (Signed)
You have been seen by your caregiver because of dental pain.  °SEEK MEDICAL ATTENTION IF: °The exam and treatment you received today has been provided on an emergency basis only. This is not a substitute for complete medical or dental care. If your problem worsens or new symptoms (problems) appear, and you are unable to arrange prompt follow-up care with your dentist, call or return to this location. °CALL YOUR DENTIST OR RETURN IMMEDIATELY IF you develop a fever, rash, difficulty breathing or swallowing, neck or facial swelling, or other potentially serious concerns. ° ° °[employer]  °[employer]  °[facility]  °[address]  °[place], [place] [address]  °Phone [phone]  °[facility] [employer] [facility] [place], [place], exemplifies the Dental School?s vision to improve the health and quality of life of all [place] by creating leaders with a passion to care for the underserved and by leading the nation in community-based, service learning oral health education. °We are committed to offering comprehensive general dental services for adults, children and special needs patients in a safe, caring and professional setting. ° °Appointments: Our clinic is open Monday through Friday 8:00 a.m. until 5:00 p.m. The amount of time scheduled for an appointment depends on the patient?s specific needs. We ask that you keep your appointed time for care or provide 24-hour notice of all appointment changes. Parents or legal guardians must accompany minor children. ° °Payment for Services: Medicaid and other insurance plans are welcome. Payment for services is due when services are rendered and may be made by cash or credit card. If you have dental insurance, we will assist you with your claim submission.  ° °Emergencies: Emergency services will be provided Monday through Friday on a  walk-in basis. Please arrive early for emergency services. After hours emergency services will be provided for patients of record as required. ° °Services:  °Comprehensive General Dentistry  °Children?s Dentistry  °Oral Surgery - Extractions  °Root Canals  °Sealants and Tooth Colored Fillings  °Crowns and Bridges  °Dentures and Partial Dentures  °Implant Services  °Periodontal Services and Cleanings  °Cosmetic Tooth Whitening  °Digital Radiography  °3-D/[facility] Imaging ° ° °

## 2016-06-14 NOTE — ED Triage Notes (Signed)
Pt reports left side tooth pain and earache. Started yesterday, no swelling noted.

## 2016-06-14 NOTE — ED Provider Notes (Signed)
[place] DEPT Provider Note   CSN: [id number] Arrival date & time: 06/14/16  1000  By signing my name below, I, Rayna Sexton, attest that this documentation has been prepared under the direction and in the presence of Margarita Mail, PA-C. Electronically Signed: Rayna Sexton, ED Scribe. 06/14/16. 10:45 AM.   History   Chief Complaint Chief Complaint  Patient presents with  . Dental Pain  . Otalgia   HPI HPI Comments: Stacy Cruz is a 25 y.o. female who presents to the Emergency Department complaining of worsening, moderate, left lower dental pain x 2 days. She reports a h/o dental issues in the affected region but denies any of this severity. Her pain worsens with palpation of the region and chewing. She reports associated, moderate, sharp, left ear pain, tinnitus, mild swelling across her gum in the affected region and yellow left ear discharge which she noticed this morning. She denies a h/o of smoking and does not have a regular dental provider. No other associated symptoms at this time.   The history is provided by the patient. No language interpreter was used.   Past Medical History:  Diagnosis Date  . Anemia   . BV (bacterial vaginosis)    H/O  . H/O candidiasis     Patient Active Problem List   Diagnosis Date Noted  . Dermoid cyst of right ovary 01/12/2015  . Gonorrhea 01/12/2015  . Anemia 01/16/2012   Past Surgical History:  Procedure Laterality Date  . LAPAROSCOPIC OVARIAN CYSTECTOMY Left 01/12/2015   Procedure: LAPAROSCOPIC OVARIAN CYSTECTOMY;  Surgeon: Lavonia Drafts, MD;  Location: [facility];  Service: Gynecology;  Laterality: Left;  Dermoid cyst  . WISDOM TOOTH EXTRACTION  2011    OB History    Gravida Para Term Preterm AB Living   2 2 2  0 0 2   SAB TAB Ectopic Multiple Live Births   0 0 0 0 2       Home Medications    Prior to Admission medications   Medication Sig Start Date End Date Taking? Authorizing Provider  amoxicillin  (AMOXIL) 500 MG capsule Take 1 capsule (500 mg total) by mouth 3 (three) times daily. 06/14/16   Margarita Mail, PA-C  ciprofloxacin-dexamethasone (CIPRODEX) otic suspension Place 4 drops into the left ear 2 (two) times daily. 06/14/16   Margarita Mail, PA-C  fluconazole (DIFLUCAN) 150 MG tablet Take 1 tablet (150 mg total) by mouth once. 01/22/15   Lavonia Drafts, MD  HYDROcodone-acetaminophen (NORCO) 5-325 MG tablet Take 1-2 tablets by mouth every 6 (six) hours as needed for severe pain. 06/14/16   Margarita Mail, PA-C  ibuprofen (ADVIL,MOTRIN) 600 MG tablet Take 1 tablet (600 mg total) by mouth every 6 (six) hours as needed. 01/12/15   Lavonia Drafts, MD    Family History Family History  Problem Relation Age of Onset  . Heart disease Sister   . Hypertension Maternal Grandmother   . Diabetes Maternal Grandmother   . Cancer Maternal Grandmother   . Hypertension Maternal Grandfather   . Diabetes Maternal Grandfather   . Cancer Maternal Grandfather   . Diabetes Paternal Grandmother   . Hypertension Paternal Grandmother   . Heart disease Paternal Grandfather   . Hypertension Paternal Grandfather   . Diabetes Maternal Uncle   . Anesthesia problems Neg Hx   . Hypotension Neg Hx   . Malignant hyperthermia Neg Hx   . Pseudochol deficiency Neg Hx     Social History Social History  Substance Use Topics  .  Smoking status: Never Smoker  . Smokeless tobacco: Never Used  . Alcohol use No     Allergies   Review of patient's allergies indicates no known allergies.   Review of Systems Review of Systems  Constitutional: Negative for chills and fever.  HENT: Positive for dental problem, ear discharge, ear pain, facial swelling and tinnitus.    Physical Exam Updated Vital Signs BP 127/71 (BP Location: Right Arm)   Pulse 83   Temp 99 F (37.2 C) (Oral)   Resp 18   Ht 5\' 3"  (1.6 m)   Wt 72.6 kg   LMP 06/12/2016 (Exact Date)   SpO2 100%   BMI 28.34 kg/m   Physical  Exam  Constitutional: She is oriented to person, place, and time. She appears well-developed and well-nourished.  HENT:  Head: Normocephalic and atraumatic.  Left lower 1st molar has an area where the tooth is cracked to the posterior portion. Mild gingival erythema noted. No signs of dental abscess.   Eyes: EOM are normal.  Neck: Normal range of motion.  Cardiovascular: Normal rate.   Pulmonary/Chest: Effort normal. No respiratory distress.  Abdominal: Soft.  Musculoskeletal: Normal range of motion.  Neurological: She is alert and oriented to person, place, and time.  Skin: Skin is warm and dry.  Psychiatric: She has a normal mood and affect.  Nursing note and vitals reviewed.  ED Treatments / Results  Labs (all labs ordered are listed, but only abnormal results are displayed) Labs Reviewed - No data to display  EKG  EKG Interpretation None       Radiology No results found.  Procedures Procedures  DIAGNOSTIC STUDIES: Oxygen Saturation is 100% on RA, normal by my interpretation.    COORDINATION OF CARE: 10:41 AM Discussed next steps with pt. Pt verbalized understanding and is agreeable with the plan.    Medications Ordered in ED Medications  HYDROcodone-acetaminophen (NORCO/VICODIN) 5-325 MG per tablet 1 tablet (1 tablet Oral Given 06/14/16 1052)  ondansetron (ZOFRAN-ODT) disintegrating tablet 8 mg (8 mg Oral Given 06/14/16 1052)     Initial Impression / Assessment and Plan / ED Course  I have reviewed the triage vital signs and the nursing notes.  Pertinent labs & imaging results that were available during my care of the patient were reviewed by me and considered in my medical decision making (see chart for details).  Clinical Course    Periodontal Exam   Patient with dentalgia.  No abscess requiring immediate incision and drainage.  Exam not concerning for Ludwig's angina or pharyngeal abscess.Pt instructed to follow-up with dentist.  Discussed return  precautions. Pt safe for discharge.  I personally performed the services described in this documentation, which was scribed in my presence. The recorded information has been reviewed and is accurate.    Final Clinical Impressions(s) / ED Diagnoses   Final diagnoses:  Dental infection    New Prescriptions Discharge Medication List as of 06/14/2016 10:47 AM    START taking these medications   Details  amoxicillin (AMOXIL) 500 MG capsule Take 1 capsule (500 mg total) by mouth 3 (three) times daily., Starting Wed 06/14/2016, Print    HYDROcodone-acetaminophen (NORCO) 5-325 MG tablet Take 1-2 tablets by mouth every 6 (six) hours as needed for severe pain., Starting Wed 06/14/2016, Print          [place], PA-C 06/15/16 2057    Sharlett Iles, MD 06/19/16 [phone]

## 2016-10-03 ENCOUNTER — Emergency Department (HOSPITAL_BASED_OUTPATIENT_CLINIC_OR_DEPARTMENT_OTHER)
Admission: EM | Admit: 2016-10-03 | Discharge: 2016-10-03 | Disposition: A | Discharge status: Home / Self Care | Payer: Self-pay | Attending: Emergency Medicine | Admitting: Emergency Medicine

## 2016-10-03 ENCOUNTER — Encounter (HOSPITAL_BASED_OUTPATIENT_CLINIC_OR_DEPARTMENT_OTHER): Payer: Self-pay | Admitting: *Deleted

## 2016-10-03 ENCOUNTER — Emergency Department (HOSPITAL_BASED_OUTPATIENT_CLINIC_OR_DEPARTMENT_OTHER): Payer: Self-pay

## 2016-10-03 DIAGNOSIS — N760 Acute vaginitis: Secondary | ICD-10-CM

## 2016-10-03 DIAGNOSIS — A5901 Trichomonal vulvovaginitis: Secondary | ICD-10-CM | POA: Insufficient documentation

## 2016-10-03 DIAGNOSIS — B9689 Other specified bacterial agents as the cause of diseases classified elsewhere: Secondary | ICD-10-CM

## 2016-10-03 DIAGNOSIS — A599 Trichomoniasis, unspecified: Secondary | ICD-10-CM

## 2016-10-03 DIAGNOSIS — R1010 Upper abdominal pain, unspecified: Secondary | ICD-10-CM

## 2016-10-03 LAB — URINALYSIS, ROUTINE W REFLEX MICROSCOPIC
Bilirubin Urine: NEGATIVE
Glucose, UA: NEGATIVE mg/dL
Hgb urine dipstick: NEGATIVE
KETONES UR: NEGATIVE mg/dL
NITRITE: NEGATIVE
PROTEIN: NEGATIVE mg/dL
Specific Gravity, Urine: 1.02 (ref 1.005–1.030)
pH: 5.5 (ref 5.0–8.0)

## 2016-10-03 LAB — COMPREHENSIVE METABOLIC PANEL
ALBUMIN: 3.8 g/dL (ref 3.5–5.0)
ALT: 16 U/L (ref 14–54)
ANION GAP: 8 (ref 5–15)
AST: 15 U/L (ref 15–41)
Alkaline Phosphatase: 47 U/L (ref 38–126)
BILIRUBIN TOTAL: 0.4 mg/dL (ref 0.3–1.2)
BUN: 11 mg/dL (ref 6–20)
CO2: 26 mmol/L (ref 22–32)
Calcium: 8.7 mg/dL — ABNORMAL LOW (ref 8.9–10.3)
Chloride: 100 mmol/L — ABNORMAL LOW (ref 101–111)
Creatinine, Ser: 0.88 mg/dL (ref 0.44–1.00)
GFR calc Af Amer: >60 mL/min (ref >60)
GLUCOSE: 91 mg/dL (ref 65–99)
POTASSIUM: 4 mmol/L (ref 3.5–5.1)
Sodium: 134 mmol/L — ABNORMAL LOW (ref 135–145)
TOTAL PROTEIN: 7.5 g/dL (ref 6.5–8.1)

## 2016-10-03 LAB — URINALYSIS, MICROSCOPIC (REFLEX)

## 2016-10-03 LAB — WET PREP, GENITAL
Sperm: NONE SEEN
Yeast Wet Prep HPF POC: NONE SEEN

## 2016-10-03 LAB — CBC WITH DIFFERENTIAL/PLATELET
BASOS PCT: 0 %
Basophils Absolute: 0 10*3/uL (ref 0.0–0.1)
Eosinophils Absolute: 0.2 10*3/uL (ref 0.0–0.7)
Eosinophils Relative: 4 %
HEMATOCRIT: 39.3 % (ref 36.0–46.0)
Hemoglobin: 12.1 g/dL (ref 12.0–15.0)
Lymphocytes Relative: 20 %
Lymphs Abs: 1.2 10*3/uL (ref 0.7–4.0)
MCH: 24.1 pg — ABNORMAL LOW (ref 26.0–34.0)
MCHC: 30.8 g/dL (ref 30.0–36.0)
MCV: 78.3 fL (ref 78.0–100.0)
MONO ABS: 0.4 10*3/uL (ref 0.1–1.0)
MONOS PCT: 7 %
NEUTROS ABS: 4.2 10*3/uL (ref 1.7–7.7)
Neutrophils Relative %: 69 %
PLATELETS: 227 10*3/uL (ref 150–400)
RBC: 5.02 MIL/uL (ref 3.87–5.11)
RDW: 14.7 % (ref 11.5–15.5)
WBC: 6.1 10*3/uL (ref 4.0–10.5)

## 2016-10-03 LAB — PREGNANCY, URINE: Preg Test, Ur: NEGATIVE

## 2016-10-03 LAB — LIPASE, BLOOD: LIPASE: 18 U/L (ref 11–51)

## 2016-10-03 MED ORDER — METRONIDAZOLE 500 MG PO TABS: 500.0000 mg | ORAL_TABLET | Freq: Two times a day (BID) | ORAL | 0 refills | Status: DC

## 2016-10-03 MED ORDER — ONDANSETRON HCL 4 MG/2ML IJ SOLN
4.0000 mg | Freq: Once | INTRAMUSCULAR | Status: AC
  Administered 2016-10-03: 4 mg via INTRAVENOUS
  Filled 2016-10-03: qty 2

## 2016-10-03 MED ORDER — KETOROLAC TROMETHAMINE 30 MG/ML IJ SOLN
30.0000 mg | Freq: Once | INTRAMUSCULAR | Status: AC
  Administered 2016-10-03: 30 mg via INTRAVENOUS
  Filled 2016-10-03: qty 1

## 2016-10-03 MED ORDER — AZITHROMYCIN 250 MG PO TABS
1000.0000 mg | ORAL_TABLET | Freq: Once | ORAL | Status: AC
  Administered 2016-10-03: 1000 mg via ORAL
  Filled 2016-10-03: qty 4

## 2016-10-03 MED ORDER — CEFTRIAXONE SODIUM 250 MG IJ SOLR
250.0000 mg | Freq: Once | INTRAMUSCULAR | Status: AC
  Administered 2016-10-03: 250 mg via INTRAMUSCULAR
  Filled 2016-10-03: qty 250

## 2016-10-03 MED ORDER — LIDOCAINE HCL (PF) 1 % IJ SOLN
INTRAMUSCULAR | Status: AC
  Filled 2016-10-03: qty 5

## 2016-10-03 MED ORDER — MORPHINE SULFATE (PF) 4 MG/ML IV SOLN
4.0000 mg | Freq: Once | INTRAVENOUS | Status: AC
Start: 2016-10-03 — End: 2016-10-03
  Administered 2016-10-03: 4 mg via INTRAVENOUS
  Filled 2016-10-03: qty 1

## 2016-10-03 MED ORDER — GI COCKTAIL ~~LOC~~
30.0000 mL | Freq: Once | ORAL | Status: AC
  Administered 2016-10-03: 30 mL via ORAL
  Filled 2016-10-03: qty 30

## 2016-10-03 MED ORDER — METRONIDAZOLE 500 MG PO TABS
2000.0000 mg | ORAL_TABLET | Freq: Once | ORAL | Status: AC
Start: 2016-10-03 — End: 2016-10-03
  Administered 2016-10-03: 2000 mg via ORAL
  Filled 2016-10-03: qty 4

## 2016-10-03 MED ORDER — ONDANSETRON HCL 4 MG PO TABS: 4.0000 mg | ORAL_TABLET | Freq: Three times a day (TID) | ORAL | 0 refills | Status: DC | PRN

## 2016-10-03 MED FILL — metroNIDAZOLE 500 MG TABS: 500 | 7 days supply | Qty: 14 | Fill #0

## 2016-10-03 NOTE — ED Notes (Signed)
Patient transported to Ultrasound 

## 2016-10-03 NOTE — ED Notes (Signed)
EDP at pt's bedside to give pt the results of her test. After EDP left the pt requested that her partner come into the room and this RN to explain to her partner her tests results.

## 2016-10-03 NOTE — ED Provider Notes (Signed)
[place] DEPT [facility] Provider Note   CSN: [id number] Arrival date & time: 10/03/16  1258     History   Chief Complaint Chief Complaint  Patient presents with  . Abdominal Pain    HPI Stacy Cruz is a 26 y.o. female.  HPI   26 year old female with history of ovarian cyst, anemia, prior STD presenting with complaints of abdominal pain. Patient states since yesterday afternoon she developed gradual onset of epigastric right upper quadrant abdominal pain. Her pain is a sharp, 8 out of 10, persistent, with associate nausea, and she has vomited to 6 episodes of nonbloody nonbilious vomit. She had a normal bowel movement yesterday. She endorsed mild chills and decrease in appetite. No specific treatment tried. Laying on the side seems to make the pain a bit better. She denies any associated fever, chest pain, shortness breath, productive cough, back pain, dysuria, hematuria, vaginal bleeding, vaginal discharge, or rash. She did drank 2 shots of alcohol last night her New Year's but the pain started before that.  Past Medical History:  Diagnosis Date  . Anemia   . BV (bacterial vaginosis)    H/O  . H/O candidiasis     Patient Active Problem List   Diagnosis Date Noted  . Dermoid cyst of right ovary 01/12/2015  . Gonorrhea 01/12/2015  . Anemia 01/16/2012    Past Surgical History:  Procedure Laterality Date  . LAPAROSCOPIC OVARIAN CYSTECTOMY Left 01/12/2015   Procedure: LAPAROSCOPIC OVARIAN CYSTECTOMY;  Surgeon: Lavonia Drafts, MD;  Location: [facility];  Service: Gynecology;  Laterality: Left;  Dermoid cyst  . WISDOM TOOTH EXTRACTION  2011    OB History    Gravida Para Term Preterm AB Living   2 2 2  0 0 2   SAB TAB Ectopic Multiple Live Births   0 0 0 0 2       Home Medications    Prior to Admission medications   Medication Sig Start Date End Date Taking? Authorizing Provider  amoxicillin (AMOXIL) 500 MG capsule Take 1 capsule (500 mg total) by mouth 3  (three) times daily. 06/14/16   Margarita Mail, PA-C  ciprofloxacin-dexamethasone (CIPRODEX) otic suspension Place 4 drops into the left ear 2 (two) times daily. 06/14/16   Margarita Mail, PA-C  fluconazole (DIFLUCAN) 150 MG tablet Take 1 tablet (150 mg total) by mouth once. 01/22/15   Lavonia Drafts, MD  HYDROcodone-acetaminophen (NORCO) 5-325 MG tablet Take 1-2 tablets by mouth every 6 (six) hours as needed for severe pain. 06/14/16   Margarita Mail, PA-C  ibuprofen (ADVIL,MOTRIN) 600 MG tablet Take 1 tablet (600 mg total) by mouth every 6 (six) hours as needed. 01/12/15   Lavonia Drafts, MD    Family History Family History  Problem Relation Age of Onset  . Heart disease Sister   . Hypertension Maternal Grandmother   . Diabetes Maternal Grandmother   . Cancer Maternal Grandmother   . Hypertension Maternal Grandfather   . Diabetes Maternal Grandfather   . Cancer Maternal Grandfather   . Diabetes Paternal Grandmother   . Hypertension Paternal Grandmother   . Heart disease Paternal Grandfather   . Hypertension Paternal Grandfather   . Diabetes Maternal Uncle   . Anesthesia problems Neg Hx   . Hypotension Neg Hx   . Malignant hyperthermia Neg Hx   . Pseudochol deficiency Neg Hx     Social History Social History  Substance Use Topics  . Smoking status: Never Smoker  . Smokeless tobacco: Never Used  . Alcohol  use No     Allergies   Patient has no known allergies.   Review of Systems Review of Systems  All other systems reviewed and are negative.    Physical Exam Updated Vital Signs BP 112/92 (BP Location: Right Arm)   Pulse 86   Temp 98.4 F (36.9 C) (Oral)   Resp 18   Ht 5\' 4"  (1.626 m)   Wt 72.6 kg   LMP 09/19/2016   SpO2 100%   BMI 27.46 kg/m   Physical Exam  Constitutional: She is oriented to person, place, and time. She appears well-developed and well-nourished. No distress.  HENT:  Head: Atraumatic.  Eyes: Conjunctivae are normal.  Neck:  Neck supple.  Cardiovascular: Normal rate and regular rhythm.   Pulmonary/Chest: Effort normal and breath sounds normal.  Abdominal: Soft. There is tenderness (Mild epigastric and right upper quadrant tenderness on palpation without guarding or rebound tenderness. Negative Murphy sign, no pain at McBurney's point).  No CVA tenderness  Genitourinary:  Genitourinary Comments: Pelvic exam: RN in room as chaperone, external female genitalia normal with no signs of lesions or injuries. Speculum exam shows normal cervix with no obvious discharge. IUD string in place. Bimanual exam with no adnexal tenderness, no cervical motion tenderness, uterus normal size and nontender, no masses appreciated. The external cervical os is closed.   Neurological: She is alert and oriented to person, place, and time.  Skin: No rash noted.  Psychiatric: She has a normal mood and affect.  Nursing note and vitals reviewed.    ED Treatments / Results  Labs (all labs ordered are listed, but only abnormal results are displayed) Labs Reviewed  WET PREP, GENITAL - Abnormal; Notable for the following:       Result Value   Trich, Wet Prep PRESENT (*)    Clue Cells Wet Prep HPF POC PRESENT (*)    WBC, Wet Prep HPF POC MANY (*)    All other components within normal limits  CBC WITH DIFFERENTIAL/PLATELET - Abnormal; Notable for the following:    MCH 24.1 (*)    All other components within normal limits  COMPREHENSIVE METABOLIC PANEL - Abnormal; Notable for the following:    Sodium 134 (*)    Chloride 100 (*)    Calcium 8.7 (*)    All other components within normal limits  URINALYSIS, ROUTINE W REFLEX MICROSCOPIC - Abnormal; Notable for the following:    APPearance CLOUDY (*)    Leukocytes, UA SMALL (*)    All other components within normal limits  URINALYSIS, MICROSCOPIC (REFLEX) - Abnormal; Notable for the following:    Bacteria, UA MANY (*)    Squamous Epithelial / LPF 6-30 (*)    All other components within  normal limits  LIPASE, BLOOD  PREGNANCY, URINE  HIV ANTIBODY (ROUTINE TESTING)  RPR  GC/CHLAMYDIA PROBE AMP ([place]) NOT AT [facility]    EKG  EKG Interpretation None       Radiology US Abdomen Limited Ruq  Result Date: 10/03/2016 CLINICAL DATA:  Epigastric right upper quadrant pain with vomiting for 2 days. EXAM: US ABDOMEN LIMITED - RIGHT UPPER QUADRANT COMPARISON:  None. FINDINGS: Gallbladder: No gallstones or wall thickening visualized. No sonographic Murphy sign noted by sonographer. Common bile duct: Diameter: 1 mm.  Normal. Liver: No focal lesion identified. Within normal limits in parenchymal echogenicity. IMPRESSION: Normal exam. Electronically Signed   By: Lorriane Shire M.D.   On: 10/03/2016 14:39    Procedures Procedures (including critical care time)  Medications Ordered in ED Medications  lidocaine (PF) (XYLOCAINE) 1 % injection (not administered)  gi cocktail (Maalox,Lidocaine,Donnatal) (30 mLs Oral Given 10/03/16 1355)  ondansetron (ZOFRAN) injection 4 mg (4 mg Intravenous Given 10/03/16 1355)  morphine 4 MG/ML injection 4 mg (4 mg Intravenous Given 10/03/16 1355)  ketorolac (TORADOL) 30 MG/ML injection 30 mg (30 mg Intravenous Given 10/03/16 1517)  cefTRIAXone (ROCEPHIN) injection 250 mg (250 mg Intramuscular Given 10/03/16 1637)  azithromycin (ZITHROMAX) tablet 1,000 mg (1,000 mg Oral Given 10/03/16 1634)  metroNIDAZOLE (FLAGYL) tablet 2,000 mg (2,000 mg Oral Given 10/03/16 1634)     Initial Impression / Assessment and Plan / ED Course  I have reviewed the triage vital signs and the nursing notes.  Pertinent labs & imaging results that were available during my care of the patient were reviewed by me and considered in my medical decision making (see chart for details).  Clinical Course     BP 102/59   Pulse 84   Temp 98.4 F (36.9 C) (Oral)   Resp 18   Ht 5\' 4"  (1.626 m)   Wt 72.6 kg   LMP 09/19/2016   SpO2 99%   BMI 27.46 kg/m    Final Clinical  Impressions(s) / ED Diagnoses   Final diagnoses:  Upper abdominal pain  Trichomoniasis  BV (bacterial vaginosis)    New Prescriptions New Prescriptions   METRONIDAZOLE (FLAGYL) 500 MG TABLET    Take 1 tablet (500 mg total) by mouth 2 (two) times daily. One po bid x 7 days   ONDANSETRON (ZOFRAN) 4 MG TABLET    Take 1 tablet (4 mg total) by mouth every 8 (eight) hours as needed for nausea or vomiting.   1:47 PM Patient here with upper abdominal pain since last night. She has had fairly benign abdomen exam. No lower abdominal pain to suggest GU etiology. Doubt appendicitis. Workup initiated.  3:09 PM Patient report relief with GI cocktail but now the pain returns. Her lab is remarkable for urine shows many bacteria and evidence of trichomonas. She does not have any lower abdominal pain however after discussion, we will perform a pelvic examination to help evaluate for other potential STD. Her abdominal ultrasound shown no acute finding to suggest biliary etiology. Toradol given for pain.  3:33 PM No significant pelvic discomfort on pelvic examination.However, wet prep demonstrated evidence of trichomonas as well as many clue cells suggests BV. May WBC were noted as well. In light of these findings, patient will receive Flagyl 2 g as treatment for Trichomonas. She will also be discharge with Flagyl as treatment for BV. Rocephin and Zithromax given for coverage of potential gonorrhea MEAL infection. No significant discomfort to suggest PID. Her labs are otherwise reassuring. Patient has an appetite. She is well-appearing. She is stable for discharge. Return precaution discussed. Encouraged patient to discuss finding with sexual partner to get tested as well. Sexual abstinence until symptoms completely resolved.   Domenic Moras, PA-C 10/03/16 1640    Malvin Johns, MD 10/07/16 1710

## 2016-10-03 NOTE — Discharge Instructions (Signed)
You have been evaluated for your abdominal pain.  There are evidence of trichomonas and bacterial vaginosis on today's exam.  Please take antibiotic as prescribed and notify your partner to get tested as well.  Take zofran as needed for nausea.  Take tylenol or ibuprofen as needed for pain.  Return to the ER if your condition worsen or if you have other concerns.

## 2016-10-04 LAB — GC/CHLAMYDIA PROBE AMP (~~LOC~~) NOT AT ARMC
CHLAMYDIA, DNA PROBE: NEGATIVE
NEISSERIA GONORRHEA: NEGATIVE

## 2016-10-04 LAB — HIV ANTIBODY (ROUTINE TESTING W REFLEX): HIV Screen 4th Generation wRfx: NONREACTIVE

## 2016-10-04 LAB — RPR: RPR Ser Ql: NONREACTIVE

## 2017-02-15 ENCOUNTER — Emergency Department (HOSPITAL_BASED_OUTPATIENT_CLINIC_OR_DEPARTMENT_OTHER)
Admission: EM | Admit: 2017-02-15 | Discharge: 2017-02-15 | Disposition: A | Discharge status: Home / Self Care | Payer: Self-pay | Attending: Emergency Medicine | Admitting: Emergency Medicine

## 2017-02-15 ENCOUNTER — Emergency Department (HOSPITAL_BASED_OUTPATIENT_CLINIC_OR_DEPARTMENT_OTHER): Payer: Self-pay

## 2017-02-15 ENCOUNTER — Encounter (HOSPITAL_BASED_OUTPATIENT_CLINIC_OR_DEPARTMENT_OTHER): Payer: Self-pay | Admitting: *Deleted

## 2017-02-15 DIAGNOSIS — R51 Headache: Secondary | ICD-10-CM | POA: Insufficient documentation

## 2017-02-15 DIAGNOSIS — R519 Headache, unspecified: Secondary | ICD-10-CM

## 2017-02-15 DIAGNOSIS — N898 Other specified noninflammatory disorders of vagina: Secondary | ICD-10-CM | POA: Insufficient documentation

## 2017-02-15 DIAGNOSIS — R102 Pelvic and perineal pain: Secondary | ICD-10-CM | POA: Insufficient documentation

## 2017-02-15 LAB — COMPREHENSIVE METABOLIC PANEL
ALK PHOS: 37 U/L — AB (ref 38–126)
ALT: 13 U/L — ABNORMAL LOW (ref 14–54)
ANION GAP: 8 (ref 5–15)
AST: 21 U/L (ref 15–41)
Albumin: 3.7 g/dL (ref 3.5–5.0)
BILIRUBIN TOTAL: 0.5 mg/dL (ref 0.3–1.2)
BUN: 12 mg/dL (ref 6–20)
CALCIUM: 8.7 mg/dL — AB (ref 8.9–10.3)
CO2: 27 mmol/L (ref 22–32)
CREATININE: 0.94 mg/dL (ref 0.44–1.00)
Chloride: 104 mmol/L (ref 101–111)
Glucose, Bld: 87 mg/dL (ref 65–99)
Potassium: 3.7 mmol/L (ref 3.5–5.1)
SODIUM: 139 mmol/L (ref 135–145)
TOTAL PROTEIN: 7.4 g/dL (ref 6.5–8.1)

## 2017-02-15 LAB — LIPASE, BLOOD: Lipase: 19 U/L (ref 11–51)

## 2017-02-15 LAB — CBC
HCT: 36.8 % (ref 36.0–46.0)
HEMOGLOBIN: 11.8 g/dL — AB (ref 12.0–15.0)
MCH: 25 pg — ABNORMAL LOW (ref 26.0–34.0)
MCHC: 32.1 g/dL (ref 30.0–36.0)
MCV: 78 fL (ref 78.0–100.0)
PLATELETS: 220 10*3/uL (ref 150–400)
RBC: 4.72 MIL/uL (ref 3.87–5.11)
RDW: 14.7 % (ref 11.5–15.5)
WBC: 4.6 10*3/uL (ref 4.0–10.5)

## 2017-02-15 LAB — URINALYSIS, MICROSCOPIC (REFLEX)

## 2017-02-15 LAB — URINALYSIS, ROUTINE W REFLEX MICROSCOPIC
Bilirubin Urine: NEGATIVE
GLUCOSE, UA: NEGATIVE mg/dL
KETONES UR: NEGATIVE mg/dL
LEUKOCYTES UA: NEGATIVE
Nitrite: NEGATIVE
PROTEIN: NEGATIVE mg/dL
Specific Gravity, Urine: 1.021 (ref 1.005–1.030)
pH: 6 (ref 5.0–8.0)

## 2017-02-15 LAB — PREGNANCY, URINE: PREG TEST UR: NEGATIVE

## 2017-02-15 MED ORDER — PROCHLORPERAZINE EDISYLATE 5 MG/ML IJ SOLN
10.0000 mg | Freq: Once | INTRAMUSCULAR | Status: AC
  Administered 2017-02-15: 10 mg via INTRAVENOUS
  Filled 2017-02-15: qty 2

## 2017-02-15 MED ORDER — SODIUM CHLORIDE 0.9 % IV BOLUS (SEPSIS)
1000.0000 mL | Freq: Once | INTRAVENOUS | Status: AC
  Administered 2017-02-15: 1000 mL via INTRAVENOUS

## 2017-02-15 MED ORDER — DEXAMETHASONE SODIUM PHOSPHATE 10 MG/ML IJ SOLN
10.0000 mg | Freq: Once | INTRAMUSCULAR | Status: AC
  Administered 2017-02-15: 10 mg via INTRAVENOUS
  Filled 2017-02-15: qty 1

## 2017-02-15 MED ORDER — MORPHINE SULFATE (PF) 4 MG/ML IV SOLN
4.0000 mg | Freq: Once | INTRAVENOUS | Status: AC
  Administered 2017-02-15: 4 mg via INTRAVENOUS
  Filled 2017-02-15: qty 1

## 2017-02-15 MED ORDER — DIPHENHYDRAMINE HCL 50 MG/ML IJ SOLN
25.0000 mg | Freq: Once | INTRAMUSCULAR | Status: AC
Start: 2017-02-15 — End: 2017-02-15
  Administered 2017-02-15: 25 mg via INTRAVENOUS
  Filled 2017-02-15: qty 1

## 2017-02-15 MED ORDER — KETOROLAC TROMETHAMINE 30 MG/ML IJ SOLN
30.0000 mg | Freq: Once | INTRAMUSCULAR | Status: AC
  Administered 2017-02-15: 30 mg via INTRAVENOUS
  Filled 2017-02-15: qty 1

## 2017-02-15 MED ORDER — HYDROCODONE-ACETAMINOPHEN 5-325 MG PO TABS: 1.0000 | ORAL_TABLET | ORAL | 0 refills | Status: DC | PRN

## 2017-02-15 NOTE — ED Notes (Signed)
Pt back from ultrasound. Pan 5/10.

## 2017-02-15 NOTE — ED Triage Notes (Signed)
Patient states she has had a migraine headache for the last five days.  States she has a history of the same, and has had them last for several days in a row.  States she also has lower abdominal pain and feels like her IUD has shifted.  Denies N/V/D, currently having her menstrual cycle.

## 2017-02-15 NOTE — ED Notes (Signed)
Patient transported to Ultrasound 

## 2017-02-15 NOTE — ED Notes (Signed)
Pt given ginger ale.

## 2017-02-15 NOTE — Discharge Instructions (Signed)
Please use the pain medicine to help with your headache and pelvic pain. Please follow-up with your OB/GYN for further assessment of your pelvic pain and discharge. Please stay hydrated. Please rest. If any symptoms change or worsen, please return to the nearest emergency department. Please also follow up with your primary care physician to discuss further headache management strategies.

## 2017-02-15 NOTE — ED Notes (Signed)
Urine sent to the lab

## 2017-02-15 NOTE — ED Notes (Signed)
ED Provider at bedside. 

## 2017-02-15 NOTE — ED Provider Notes (Signed)
[facility] Provider Note   CSN: [id number] Arrival date & time: 02/15/17  1320     History   Chief Complaint Chief Complaint  Patient presents with  . Migraine  . Abdominal Pain    HPI Stacy Cruz is a 26 y.o. female.  The history is provided by the patient and medical records.  Headache   This is a recurrent problem. The current episode started more than 2 days ago. The problem occurs constantly. The problem has not changed since onset.The headache is associated with bright light and loud noise. The pain is located in the bilateral region. The quality of the pain is described as dull. The pain is at a severity of 9/10. The pain is severe. Pertinent negatives include no fever, no malaise/fatigue, no chest pressure, no near-syncope, no palpitations, no syncope, no shortness of breath, no nausea and no vomiting. She has tried nothing for the symptoms.  Abdominal Pain   This is a new problem. The current episode started more than 2 days ago. The problem occurs constantly. The problem has been gradually improving. The pain is associated with an unknown factor. The pain is located in the suprapubic region. The quality of the pain is aching and cramping. The pain is at a severity of 3/10. The pain is mild. Associated symptoms include headaches. Pertinent negatives include fever, diarrhea, melena, nausea, vomiting, constipation, dysuria and frequency. The symptoms are aggravated by palpation. Nothing relieves the symptoms.    Past Medical History:  Diagnosis Date  . Anemia   . BV (bacterial vaginosis)    H/O  . H/O candidiasis     Patient Active Problem List   Diagnosis Date Noted  . Dermoid cyst of right ovary 01/12/2015  . Gonorrhea 01/12/2015  . Anemia 01/16/2012    Past Surgical History:  Procedure Laterality Date  . LAPAROSCOPIC OVARIAN CYSTECTOMY Left 01/12/2015   Procedure: LAPAROSCOPIC OVARIAN CYSTECTOMY;  Surgeon: Lavonia Drafts, MD;  Location:  [facility];  Service: Gynecology;  Laterality: Left;  Dermoid cyst  . WISDOM TOOTH EXTRACTION  2011    OB History    Gravida Para Term Preterm AB Living   2 2 2  0 0 2   SAB TAB Ectopic Multiple Live Births   0 0 0 0 2       Home Medications    Prior to Admission medications   Medication Sig Start Date End Date Taking? Authorizing Provider  amoxicillin (AMOXIL) 500 MG capsule Take 1 capsule (500 mg total) by mouth 3 (three) times daily. 06/14/16   Margarita Mail, PA-C  ciprofloxacin-dexamethasone (CIPRODEX) otic suspension Place 4 drops into the left ear 2 (two) times daily. 06/14/16   Harris, Vernie Shanks, PA-C  fluconazole (DIFLUCAN) 150 MG tablet Take 1 tablet (150 mg total) by mouth once. 01/22/15   Lavonia Drafts, MD  HYDROcodone-acetaminophen (NORCO) 5-325 MG tablet Take 1-2 tablets by mouth every 6 (six) hours as needed for severe pain. 06/14/16   Harris, Vernie Shanks, PA-C  ibuprofen (ADVIL,MOTRIN) 600 MG tablet Take 1 tablet (600 mg total) by mouth every 6 (six) hours as needed. 01/12/15   Lavonia Drafts, MD  metroNIDAZOLE (FLAGYL) 500 MG tablet Take 1 tablet (500 mg total) by mouth 2 (two) times daily. One po bid x 7 days 10/03/16   Domenic Moras, PA-C  ondansetron (ZOFRAN) 4 MG tablet Take 1 tablet (4 mg total) by mouth every 8 (eight) hours as needed for nausea or vomiting. 10/03/16   Domenic Moras, PA-C  Family History Family History  Problem Relation Age of Onset  . Heart disease Sister   . Hypertension Maternal Grandmother   . Diabetes Maternal Grandmother   . Cancer Maternal Grandmother   . Hypertension Maternal Grandfather   . Diabetes Maternal Grandfather   . Cancer Maternal Grandfather   . Diabetes Paternal Grandmother   . Hypertension Paternal Grandmother   . Heart disease Paternal Grandfather   . Hypertension Paternal Grandfather   . Diabetes Maternal Uncle   . Anesthesia problems Neg Hx   . Hypotension Neg Hx   . Malignant hyperthermia Neg Hx   . Pseudochol  deficiency Neg Hx     Social History Social History  Substance Use Topics  . Smoking status: Never Smoker  . Smokeless tobacco: Never Used  . Alcohol use No     Allergies   Patient has no known allergies.   Review of Systems Review of Systems  Constitutional: Negative for appetite change, chills, diaphoresis, fatigue, fever and malaise/fatigue.  HENT: Negative for congestion.   Eyes: Positive for photophobia. Negative for visual disturbance.  Respiratory: Negative for cough, chest tightness, shortness of breath, wheezing and stridor.   Cardiovascular: Negative for palpitations, syncope and near-syncope.  Gastrointestinal: Positive for abdominal pain. Negative for constipation, diarrhea, melena, nausea and vomiting.  Genitourinary: Positive for pelvic pain and vaginal bleeding. Negative for dysuria, frequency, menstrual problem, vaginal discharge and vaginal pain.  Musculoskeletal: Negative for back pain, neck pain and neck stiffness.  Skin: Negative for rash and wound.  Neurological: Positive for headaches. Negative for seizures, weakness, light-headedness and numbness.  Psychiatric/Behavioral: Negative for agitation.  All other systems reviewed and are negative.    Physical Exam Updated Vital Signs BP 128/86 (BP Location: Right Arm)   Pulse 84   Temp 98.4 F (36.9 C) (Oral)   Resp 18   Ht 5\' 4"  (1.626 m)   Wt 180 lb (81.6 kg)   LMP 02/12/2017 (Exact Date)   SpO2 100%   BMI 30.90 kg/m   Physical Exam  Constitutional: She is oriented to person, place, and time. She appears well-developed and well-nourished. No distress.  HENT:  Head: Normocephalic and atraumatic.  Mouth/Throat: Oropharynx is clear and moist. No oropharyngeal exudate.  Eyes: Conjunctivae are normal. Pupils are equal, round, and reactive to light.  Neck: Normal range of motion. Neck supple.  Cardiovascular: Normal rate and regular rhythm.   No murmur heard. Pulmonary/Chest: Effort normal and  breath sounds normal. No stridor. No respiratory distress. She has no wheezes. She exhibits no tenderness.  Abdominal: Soft. Normal appearance. There is tenderness in the suprapubic area. There is no rigidity, no rebound, no guarding and no CVA tenderness.    Genitourinary:  Genitourinary Comments: GU exam refused by patient.   Musculoskeletal: She exhibits no edema.  Neurological: She is alert and oriented to person, place, and time. She displays normal reflexes. No cranial nerve deficit or sensory deficit. She exhibits normal muscle tone. Coordination normal.  Skin: Skin is warm and dry. Capillary refill takes less than 2 seconds. No rash noted. No erythema.  Psychiatric: She has a normal mood and affect. Her behavior is normal.  Nursing note and vitals reviewed.    ED Treatments / Results  Labs (all labs ordered are listed, but only abnormal results are displayed) Labs Reviewed  URINALYSIS, ROUTINE W REFLEX MICROSCOPIC - Abnormal; Notable for the following:       Result Value   Hgb urine dipstick SMALL (*)    All  other components within normal limits  COMPREHENSIVE METABOLIC PANEL - Abnormal; Notable for the following:    Calcium 8.7 (*)    ALT 13 (*)    Alkaline Phosphatase 37 (*)    All other components within normal limits  CBC - Abnormal; Notable for the following:    Hemoglobin 11.8 (*)    MCH 25.0 (*)    All other components within normal limits  URINALYSIS, MICROSCOPIC (REFLEX) - Abnormal; Notable for the following:    Bacteria, UA RARE (*)    Squamous Epithelial / LPF 0-5 (*)    All other components within normal limits  PREGNANCY, URINE  LIPASE, BLOOD  GC/CHLAMYDIA PROBE AMP ([place]) NOT AT [facility]  WET PREP  (BD AFFIRM) ([place])    EKG  EKG Interpretation None       Radiology US Transvaginal Non-ob  Result Date: 02/15/2017 CLINICAL DATA:  Initial evaluation for pelvic pain for 1 week. EXAM: TRANSABDOMINAL AND TRANSVAGINAL ULTRASOUND OF PELVIS  DOPPLER ULTRASOUND OF OVARIES TECHNIQUE: Both transabdominal and transvaginal ultrasound examinations of the pelvis were performed. Transabdominal technique was performed for global imaging of the pelvis including uterus, ovaries, adnexal regions, and pelvic cul-de-sac. It was necessary to proceed with endovaginal exam following the transabdominal exam to visualize the uterus and ovaries. Color and duplex Doppler ultrasound was utilized to evaluate blood flow to the ovaries. COMPARISON:  None available. FINDINGS: Uterus Measurements: 9.0 x 3.9 x 5.0 cm. No fibroids or other mass visualized. Endometrium Thickness: 8 mm. No focal abnormality visualized. IUD in place within the lower uterine segment. Right ovary Surgically absent.  No adnexal mass. Left ovary Measurements: 3.0 x 2.7 x 2.7 cm. Normal appearance/no adnexal mass. Pulsed Doppler evaluation of the left ovary demonstrates normal low-resistance arterial and venous waveforms. Other findings No abnormal free fluid. IMPRESSION: 1. No acute abnormality within the pelvis. 2. Status post right oophorectomy. 3. IUD in place within the lower uterine segment. Electronically Signed   By: Jeannine Boga M.D.   On: 02/15/2017 17:45   US Pelvis Complete  Result Date: 02/15/2017 CLINICAL DATA:  Initial evaluation for pelvic pain for 1 week. EXAM: TRANSABDOMINAL AND TRANSVAGINAL ULTRASOUND OF PELVIS DOPPLER ULTRASOUND OF OVARIES TECHNIQUE: Both transabdominal and transvaginal ultrasound examinations of the pelvis were performed. Transabdominal technique was performed for global imaging of the pelvis including uterus, ovaries, adnexal regions, and pelvic cul-de-sac. It was necessary to proceed with endovaginal exam following the transabdominal exam to visualize the uterus and ovaries. Color and duplex Doppler ultrasound was utilized to evaluate blood flow to the ovaries. COMPARISON:  None available. FINDINGS: Uterus Measurements: 9.0 x 3.9 x 5.0 cm. No fibroids or  other mass visualized. Endometrium Thickness: 8 mm. No focal abnormality visualized. IUD in place within the lower uterine segment. Right ovary Surgically absent.  No adnexal mass. Left ovary Measurements: 3.0 x 2.7 x 2.7 cm. Normal appearance/no adnexal mass. Pulsed Doppler evaluation of the left ovary demonstrates normal low-resistance arterial and venous waveforms. Other findings No abnormal free fluid. IMPRESSION: 1. No acute abnormality within the pelvis. 2. Status post right oophorectomy. 3. IUD in place within the lower uterine segment. Electronically Signed   By: Jeannine Boga M.D.   On: 02/15/2017 17:45   [place] Art/ven Flow Abd Pelv Doppler  Result Date: 02/15/2017 CLINICAL DATA:  Initial evaluation for pelvic pain for 1 week. EXAM: TRANSABDOMINAL AND TRANSVAGINAL ULTRASOUND OF PELVIS DOPPLER ULTRASOUND OF OVARIES TECHNIQUE: Both transabdominal and transvaginal ultrasound examinations of the pelvis were  performed. Transabdominal technique was performed for global imaging of the pelvis including uterus, ovaries, adnexal regions, and pelvic cul-de-sac. It was necessary to proceed with endovaginal exam following the transabdominal exam to visualize the uterus and ovaries. Color and duplex Doppler ultrasound was utilized to evaluate blood flow to the ovaries. COMPARISON:  None available. FINDINGS: Uterus Measurements: 9.0 x 3.9 x 5.0 cm. No fibroids or other mass visualized. Endometrium Thickness: 8 mm. No focal abnormality visualized. IUD in place within the lower uterine segment. Right ovary Surgically absent.  No adnexal mass. Left ovary Measurements: 3.0 x 2.7 x 2.7 cm. Normal appearance/no adnexal mass. Pulsed Doppler evaluation of the left ovary demonstrates normal low-resistance arterial and venous waveforms. Other findings No abnormal free fluid. IMPRESSION: 1. No acute abnormality within the pelvis. 2. Status post right oophorectomy. 3. IUD in place within the lower uterine segment.  Electronically Signed   By: Jeannine Boga M.D.   On: 02/15/2017 17:45    Procedures Procedures (including critical care time)  Medications Ordered in ED Medications  sodium chloride 0.9 % bolus 1,000 mL (0 mLs Intravenous Stopped 02/15/17 1532)  sodium chloride 0.9 % bolus 1,000 mL (0 mLs Intravenous Stopped 02/15/17 1652)  dexamethasone (DECADRON) injection 10 mg (10 mg Intravenous Given 02/15/17 1539)  prochlorperazine (COMPAZINE) injection 10 mg (10 mg Intravenous Given 02/15/17 1535)  diphenhydrAMINE (BENADRYL) injection 25 mg (25 mg Intravenous Given 02/15/17 1533)  ketorolac (TORADOL) 30 MG/ML injection 30 mg (30 mg Intravenous Given 02/15/17 1627)  morphine 4 MG/ML injection 4 mg (4 mg Intravenous Given 02/15/17 1628)     Initial Impression / Assessment and Plan / ED Course  I have reviewed the triage vital signs and the nursing notes.  Pertinent labs & imaging results that were available during my care of the patient were reviewed by me and considered in my medical decision making (see chart for details).     Stacy Cruz is a 26 y.o. female with a past medical history significant for recurrent headaches, anemia, ovarian cyst, prior bacterial vaginosis and prior trichomoniasis who presents with 5 days of headache similar to prior and lower abdominal discomfort. Patient says that her headaches have felt pressure-like across her head. She says this feels similar to prior headaches. She reports associated photophobia and phonophobia. She reports feeling somewhat lightheaded when she sits up. She denies nausea, vomiting, dizziness. She denies acute vision changes at this time. She describes her headache is a 10 out of 10 severity. She denies any recent head trauma.  Patient says that her lower abdomen has been having discomfort which she describes as pinching. Patient is concerned her IUD may be out of place. Patient says that she has had the IUD for the last 3 years. Patient is  currently on her menstrual cycle. She describes her pelvic pain is an 8 out of 10 severity and associated with some yellow discharge. She denies any upper abdominal pain or back pain. She denies any chest pain or shortness of breath. She denies other symptoms.  Patient was given medications to help with her likely migrainous headache. Patient's neuro exam is completely unremarkable. Patient's abdomen was nontender in the upper abdomen slightly tender in the lower abdomen. In patient's history of cyst, suspect symptoms related to her menstrual cycle however patient will have a pelvic exam and ultrasound to look for displacement of her IUD, TOA, or worsened ovarian cysts.  Patient had diagnostic testing results seen above. Pelvic ultrasound revealed no evidence of torsion  or significant abnormality. IUDs incorrect place. This was one the patient's main concerns. After ultrasound, patient refused pelvic exam. Patient did not want to obtain swabs. Patient does not want further management of her discharge.  Patient laboratory testing is seen above. Laboratory testing reassuring. No evidence of UTI.  Suspect patient's abdominal pain is secondary to her current menstrual cycle. Patient reported her headache felt better after headache cocktail. Suspect migraine as cause of headaches.  Patient instructed to follow up with PCP for further headache management and OB/GYN for further management of pelvic pain likely secondary to menstrual pain and IUD.   Given improvement in symptoms, patient felt stable for discharge. Patient again was advised that pelvic exam was needed to rule out STI however, patient will follow up with OB/GYN for this. Patient understood risks of untreated STI.  Patient family agreed with plan of discharge and follow-up instructions. Patient understood return precautions for new or worsened symptoms. Patient had no other questions or concerns and was discharged in good condition.  Final  Clinical Impressions(s) / ED Diagnoses   Final diagnoses:  Pelvic pain  Bad headache    New Prescriptions Discharge Medication List as of 02/15/2017  6:31 PM    START taking these medications   Details  !! HYDROcodone-acetaminophen (NORCO/VICODIN) 5-325 MG tablet Take 1 tablet by mouth every 4 (four) hours as needed., Starting Thu 02/15/2017, Print     !! - Potential duplicate medications found. Please discuss with provider.      Clinical Impression: 1. Bad headache   2. Pelvic pain   3. Pelvic pain in female     Disposition: Discharge  Condition: Good  I have discussed the results, Dx and Tx plan with the pt(& family if present). He/she/they expressed understanding and agree(s) with the plan. Discharge instructions discussed at great length. Strict return precautions discussed and pt &/or family have verbalized understanding of the instructions. No further questions at time of discharge.    Discharge Medication List as of 02/15/2017  6:31 PM    START taking these medications   Details  !! HYDROcodone-acetaminophen (NORCO/VICODIN) 5-325 MG tablet Take 1 tablet by mouth every 4 (four) hours as needed., Starting Thu 02/15/2017, Print     !! - Potential duplicate medications found. Please discuss with provider.      Follow Up: [facility] [address], [address] [id number] [phone]     [place] [id number] [phone] Schedule an appointment as soon as possible for a visit    [place] [address] [id number] [facility] [address] [place] [phone]  If symptoms worsen     Tegeler, Gwenyth Allegra, MD 02/16/17 1415

## 2017-02-15 NOTE — ED Notes (Signed)
Pt now refusing pelvic exam, per family member that came out to the nurses station. EPD will be notified.

## 2017-02-23 ENCOUNTER — Emergency Department (HOSPITAL_COMMUNITY): Payer: Medicaid Other

## 2017-02-23 ENCOUNTER — Emergency Department (HOSPITAL_COMMUNITY)
Admission: EM | Admit: 2017-02-23 | Discharge: 2017-02-23 | Disposition: A | Discharge status: Home / Self Care | Payer: Medicaid Other | Attending: Emergency Medicine | Admitting: Emergency Medicine

## 2017-02-23 ENCOUNTER — Encounter (HOSPITAL_COMMUNITY): Payer: Self-pay | Admitting: *Deleted

## 2017-02-23 DIAGNOSIS — J329 Chronic sinusitis, unspecified: Secondary | ICD-10-CM | POA: Insufficient documentation

## 2017-02-23 DIAGNOSIS — R51 Headache: Secondary | ICD-10-CM | POA: Insufficient documentation

## 2017-02-23 DIAGNOSIS — R519 Headache, unspecified: Secondary | ICD-10-CM

## 2017-02-23 LAB — POC URINE PREG, ED: Preg Test, Ur: NEGATIVE

## 2017-02-23 MED ORDER — DEXAMETHASONE SODIUM PHOSPHATE 10 MG/ML IJ SOLN
10.0000 mg | Freq: Once | INTRAMUSCULAR | Status: AC
  Administered 2017-02-23: 10 mg via INTRAVENOUS
  Filled 2017-02-23: qty 1

## 2017-02-23 MED ORDER — AMOXICILLIN 500 MG PO CAPS: 500.0000 mg | ORAL_CAPSULE | Freq: Three times a day (TID) | ORAL | 0 refills | Status: AC

## 2017-02-23 MED ORDER — MAGNESIUM SULFATE 2 GM/50ML IV SOLN
2.0000 g | Freq: Once | INTRAVENOUS | Status: AC
  Administered 2017-02-23: 2 g via INTRAVENOUS
  Filled 2017-02-23: qty 50

## 2017-02-23 MED ORDER — METOCLOPRAMIDE HCL 5 MG/ML IJ SOLN
10.0000 mg | Freq: Once | INTRAMUSCULAR | Status: AC
  Administered 2017-02-23: 10 mg via INTRAVENOUS
  Filled 2017-02-23: qty 2

## 2017-02-23 MED ORDER — FLUTICASONE PROPIONATE 50 MCG/ACT NA SUSP: 2.0000 | Freq: Every day | NASAL | 2 refills | Status: AC

## 2017-02-23 MED ORDER — KETOROLAC TROMETHAMINE 30 MG/ML IJ SOLN
30.0000 mg | Freq: Once | INTRAMUSCULAR | Status: AC
  Administered 2017-02-23: 30 mg via INTRAVENOUS
  Filled 2017-02-23: qty 1

## 2017-02-23 MED ORDER — DIPHENHYDRAMINE HCL 50 MG/ML IJ SOLN
12.5000 mg | Freq: Once | INTRAMUSCULAR | Status: AC
  Administered 2017-02-23: 12.5 mg via INTRAVENOUS
  Filled 2017-02-23: qty 1

## 2017-02-23 NOTE — ED Notes (Signed)
Wheeled pt back to room from waiting room, pt placed in gown and on monitor. 

## 2017-02-23 NOTE — ED Notes (Signed)
Pt returned from CT °

## 2017-02-23 NOTE — ED Provider Notes (Signed)
[place] DEPT Provider Note   CSN: [id number] Arrival date & time: 02/23/17  6606     History   Chief Complaint Chief Complaint  Patient presents with  . Migraine    HPI Stacy Cruz is a 26 y.o. female.  HPI Stacy Cruz is a 26 y.o. female with history of anemia, presents to emergency department complaining of a headache. Patient states she has had daily headache for the last week and a half. Pain is mainly on the right side of the head. Reports associated nausea, photophobia, phonophobia, blurred vision, dizziness, states her right hand shaking. Patient was seen in emergency department for the same one week ago, at that time was treated with migraine cocktail and was prescribed Vicodin which she is taking with no relief. Patient is also taking ibuprofen for the headache which is not helping. She reports a history of similar headaches in the past, mainly around her menstrual cycle, but states there have never been this severe or lasting this long. She has never seen a neurologist or headache specialist in the past.  Past Medical History:  Diagnosis Date  . Anemia   . BV (bacterial vaginosis)    H/O  . H/O candidiasis     Patient Active Problem List   Diagnosis Date Noted  . Dermoid cyst of right ovary 01/12/2015  . Gonorrhea 01/12/2015  . Anemia 01/16/2012    Past Surgical History:  Procedure Laterality Date  . LAPAROSCOPIC OVARIAN CYSTECTOMY Left 01/12/2015   Procedure: LAPAROSCOPIC OVARIAN CYSTECTOMY;  Surgeon: Lavonia Drafts, MD;  Location: [facility];  Service: Gynecology;  Laterality: Left;  Dermoid cyst  . WISDOM TOOTH EXTRACTION  2011    OB History    Gravida Para Term Preterm AB Living   2 2 2  0 0 2   SAB TAB Ectopic Multiple Live Births   0 0 0 0 2       Home Medications    Prior to Admission medications   Medication Sig Start Date End Date Taking? Authorizing Provider  amoxicillin (AMOXIL) 500 MG capsule Take 1 capsule (500 mg  total) by mouth 3 (three) times daily. 06/14/16   Margarita Mail, PA-C  ciprofloxacin-dexamethasone (CIPRODEX) otic suspension Place 4 drops into the left ear 2 (two) times daily. 06/14/16   Harris, Vernie Shanks, PA-C  fluconazole (DIFLUCAN) 150 MG tablet Take 1 tablet (150 mg total) by mouth once. 01/22/15   Lavonia Drafts, MD  HYDROcodone-acetaminophen (NORCO) 5-325 MG tablet Take 1-2 tablets by mouth every 6 (six) hours as needed for severe pain. 06/14/16   Margarita Mail, PA-C  HYDROcodone-acetaminophen (NORCO/VICODIN) 5-325 MG tablet Take 1 tablet by mouth every 4 (four) hours as needed. 02/15/17   Tegeler, Gwenyth Allegra, MD  ibuprofen (ADVIL,MOTRIN) 600 MG tablet Take 1 tablet (600 mg total) by mouth every 6 (six) hours as needed. 01/12/15   Lavonia Drafts, MD  metroNIDAZOLE (FLAGYL) 500 MG tablet Take 1 tablet (500 mg total) by mouth 2 (two) times daily. One po bid x 7 days 10/03/16   Domenic Moras, PA-C  ondansetron (ZOFRAN) 4 MG tablet Take 1 tablet (4 mg total) by mouth every 8 (eight) hours as needed for nausea or vomiting. 10/03/16   Domenic Moras, PA-C    Family History Family History  Problem Relation Age of Onset  . Heart disease Sister   . Hypertension Maternal Grandmother   . Diabetes Maternal Grandmother   . Cancer Maternal Grandmother   . Hypertension Maternal Grandfather   . Diabetes  Maternal Grandfather   . Cancer Maternal Grandfather   . Diabetes Paternal Grandmother   . Hypertension Paternal Grandmother   . Heart disease Paternal Grandfather   . Hypertension Paternal Grandfather   . Diabetes Maternal Uncle   . Anesthesia problems Neg Hx   . Hypotension Neg Hx   . Malignant hyperthermia Neg Hx   . Pseudochol deficiency Neg Hx     Social History Social History  Substance Use Topics  . Smoking status: Never Smoker  . Smokeless tobacco: Never Used  . Alcohol use No     Allergies   Patient has no known allergies.   Review of Systems Review of Systems    Constitutional: Negative for chills and fever.  Eyes: Positive for photophobia and visual disturbance.  Respiratory: Negative for cough, chest tightness and shortness of breath.   Cardiovascular: Negative for chest pain, palpitations and leg swelling.  Gastrointestinal: Positive for nausea. Negative for abdominal pain, diarrhea and vomiting.  Genitourinary: Negative for dysuria, flank pain, pelvic pain, vaginal bleeding, vaginal discharge and vaginal pain.  Musculoskeletal: Negative for arthralgias, myalgias, neck pain and neck stiffness.  Skin: Negative for rash.  Neurological: Positive for dizziness, light-headedness and headaches. Negative for weakness.  All other systems reviewed and are negative.    Physical Exam Updated Vital Signs BP 114/78   Pulse 79   Temp 98.8 F (37.1 C) (Oral)   Resp 16   Ht 5\' 4"  (1.626 m)   Wt 81.6 kg (180 lb)   LMP 02/12/2017 (Exact Date)   SpO2 100%   BMI 30.90 kg/m   Physical Exam  Constitutional: She is oriented to person, place, and time. She appears well-developed and well-nourished. No distress.  HENT:  Head: Normocephalic.  Eyes: Conjunctivae and EOM are normal. Pupils are equal, round, and reactive to light.  Neck: Normal range of motion. Neck supple.  Cardiovascular: Normal rate, regular rhythm and normal heart sounds.   Pulmonary/Chest: Effort normal and breath sounds normal. No respiratory distress. She has no wheezes. She has no rales.  Abdominal: Soft. Bowel sounds are normal. She exhibits no distension. There is no tenderness. There is no rebound.  Musculoskeletal: She exhibits no edema.  Neurological: She is alert and oriented to person, place, and time.   Intentional tremor in her right hand. Patient is having past pointing especially the right side with finger to nose. No pronator drift. 5 out of 5 and equal strength bilaterally in upper and lower extremities.  Skin: Skin is warm and dry.  Psychiatric: She has a normal mood  and affect. Her behavior is normal.  Nursing note and vitals reviewed.    ED Treatments / Results  Labs (all labs ordered are listed, but only abnormal results are displayed) Labs Reviewed  POC URINE PREG, ED    EKG  EKG Interpretation None       Radiology Ct Head Wo Contrast  Result Date: 02/23/2017 CLINICAL DATA:  Persistent headache for 1 week. EXAM: CT HEAD WITHOUT CONTRAST TECHNIQUE: Contiguous axial images were obtained from the base of the skull through the vertex without intravenous contrast. COMPARISON:  None. FINDINGS: Brain: The ventricles are normal in size and configuration. No extra-axial fluid collections are identified. The gray-white differentiation is normal. No CT findings for acute intracranial process such as hemorrhage or infarction. No mass lesions. The brainstem and cerebellum are grossly normal. Vascular: No hyperdense vessel or unexpected calcification. Skull: No bone lesions or skull fracture. Sinuses/Orbits: Moderate mucoperiosteal thickening involving the maxillary,  ethmoid and sphenoid sinuses. Patient does not have frontal sinuses. No air-fluid levels to suggest acute sinusitis. The mastoid air cells and middle ear cavities are clear. The globes are intact. Other: No scalp lesions or hematoma. IMPRESSION: No acute intracranial findings or mass lesions. Paranasal sinus disease. Electronically Signed   By: Marijo Sanes M.D.   On: 02/23/2017 12:36    Procedures Procedures (including critical care time)  Medications Ordered in ED Medications  dexamethasone (DECADRON) injection 10 mg (not administered)  metoCLOPramide (REGLAN) injection 10 mg (not administered)  diphenhydrAMINE (BENADRYL) injection 12.5 mg (not administered)     Initial Impression / Assessment and Plan / ED Course  I have reviewed the triage vital signs and the nursing notes.  Pertinent labs & imaging results that were available during my care of the patient were reviewed by me and  considered in my medical decision making (see chart for details).     Patient with headache, persistent for a week and a half, [facility] and treated with migraine cocktail, felt better at that time but headache returned. On exam, patient has a tremor in her right hand and some pass pointing on exam. She's also reports some blurred vision. No meningismus. Exam otherwise unremarkable. Will try migraine cocktail and get CT head.  1:50 PM CT scan showing paranasal sinus disease. Her headache has not improved with Reglan, Benadryl, Decadron. We'll try Toradol magnesium.  Pt feels better. Will start on sinus medications, amoxil, flonase, tylenol motrin at home, follow up with pcp.   Vitals:   02/23/17 1145 02/23/17 1330 02/23/17 1406 02/23/17 1500  BP: 107/65  (!) 105/53 103/68  Pulse: 77 68 73 83  Resp: 16 16 16 16   Temp:      TempSrc:      SpO2: 100% 100% 100% 100%  Weight:      Height:         Final Clinical Impressions(s) / ED Diagnoses   Final diagnoses:  Sinusitis, unspecified chronicity, unspecified location  Sinus headache    New Prescriptions Discharge Medication List as of 02/23/2017  3:28 PM    START taking these medications   Details  fluticasone (FLONASE) 50 MCG/ACT nasal spray Place 2 sprays into both nostrils daily., Starting Fri 02/23/2017, Print         Liadan Guizar, [place], PA-C 02/24/17 5625    Pattricia Boss, MD 02/26/17 [phone]

## 2017-02-23 NOTE — ED Triage Notes (Signed)
Pt states migraine x 1.5 weeks.  Was tx at [facility] on 5/17 and sent home with vicodin prescription, which didn't help.  States R sided headache, photophobia, blurred vision in both eyes and nausea.

## 2017-02-23 NOTE — Discharge Instructions (Signed)
You can take Claritin or Zyrtec daily for allergies. Take amoxicillin as prescribed until all gone for sinus infection. Take Flonase daily. Follow-up with family doctor. You can take ibuprofen or excedrin migraine for headache

## 2017-03-19 ENCOUNTER — Ambulatory Visit (INDEPENDENT_AMBULATORY_CARE_PROVIDER_SITE_OTHER): Payer: Medicaid Other | Admitting: Physician Assistant

## 2017-04-03 ENCOUNTER — Ambulatory Visit (INDEPENDENT_AMBULATORY_CARE_PROVIDER_SITE_OTHER): Payer: Medicaid Other | Admitting: Physician Assistant

## 2017-05-01 ENCOUNTER — Emergency Department (HOSPITAL_BASED_OUTPATIENT_CLINIC_OR_DEPARTMENT_OTHER)
Admission: EM | Admit: 2017-05-01 | Discharge: 2017-05-02 | Disposition: A | Discharge status: Home / Self Care | Payer: Medicaid Other | Attending: Emergency Medicine | Admitting: Emergency Medicine

## 2017-05-01 ENCOUNTER — Encounter (HOSPITAL_BASED_OUTPATIENT_CLINIC_OR_DEPARTMENT_OTHER): Payer: Self-pay

## 2017-05-01 DIAGNOSIS — R0789 Other chest pain: Secondary | ICD-10-CM | POA: Insufficient documentation

## 2017-05-01 DIAGNOSIS — H9203 Otalgia, bilateral: Secondary | ICD-10-CM | POA: Insufficient documentation

## 2017-05-01 DIAGNOSIS — R51 Headache: Secondary | ICD-10-CM | POA: Insufficient documentation

## 2017-05-01 DIAGNOSIS — J020 Streptococcal pharyngitis: Secondary | ICD-10-CM | POA: Insufficient documentation

## 2017-05-01 DIAGNOSIS — R509 Fever, unspecified: Secondary | ICD-10-CM | POA: Insufficient documentation

## 2017-05-01 LAB — RAPID STREP SCREEN (MED CTR MEBANE ONLY): Streptococcus, Group A Screen (Direct): NEGATIVE

## 2017-05-01 MED ORDER — ACETAMINOPHEN 325 MG PO TABS
650.0000 mg | ORAL_TABLET | Freq: Once | ORAL | Status: AC
  Administered 2017-05-01: 650 mg via ORAL
  Filled 2017-05-01: qty 2

## 2017-05-01 NOTE — ED Provider Notes (Signed)
[facility] DEPT [facility] Provider Note   CSN: [id number] Arrival date & time: 05/01/17  2228  By signing my name below, I, Stacy Cruz, attest that this documentation has been prepared under the direction and in the presence of [facility], PA-C. Electronically Signed: Mayer Cruz, Scribe. 05/01/17. 11:54 PM. History   Chief Complaint Chief Complaint  Patient presents with  . Sore Throat   The history is provided by the patient. No language interpreter was used.    HPI Comments: Stacy Cruz is a 26 y.o. female who presents to the Emergency Department complaining of constant, gradually worsening sore throat (R>L) for several days. She has associated chest tightness, bilateral ear pain (R>L), subjective fever, chills, generalized body aches, and generalized throbbing HA. She states the pain worsens with swallowing and she has been eating/drinking less as a result. No alleviating factors noted. She has tried taking tylenol and gargling salt water with no relief. She denies drooling, nausea, vomiting, SOB, chest pain, cough, sneezing, and nasal congestion.  Past Medical History:  Diagnosis Date  . Anemia   . BV (bacterial vaginosis)    H/O  . H/O candidiasis     Patient Active Problem List   Diagnosis Date Noted  . Dermoid cyst of right ovary 01/12/2015  . Gonorrhea 01/12/2015  . Anemia 01/16/2012    Past Surgical History:  Procedure Laterality Date  . LAPAROSCOPIC OVARIAN CYSTECTOMY Left 01/12/2015   Procedure: LAPAROSCOPIC OVARIAN CYSTECTOMY;  Surgeon: Lavonia Drafts, MD;  Location: [facility];  Service: Gynecology;  Laterality: Left;  Dermoid cyst  . WISDOM TOOTH EXTRACTION  2011    OB History    Gravida Para Term Preterm AB Living   2 2 2  0 0 2   SAB TAB Ectopic Multiple Live Births   0 0 0 0 2       Home Medications    Prior to Admission medications   Medication Sig Start Date End Date Taking? Authorizing Provider  amoxicillin (AMOXIL) 500 MG capsule Take  1 capsule (500 mg total) by mouth 3 (three) times daily. 02/23/17   Kirichenko, Lahoma Rocker, PA-C  ciprofloxacin-dexamethasone (CIPRODEX) otic suspension Place 4 drops into the left ear 2 (two) times daily. Patient not taking: Reported on 02/23/2017 06/14/16   Margarita Mail, PA-C  fluconazole (DIFLUCAN) 150 MG tablet Take 1 tablet (150 mg total) by mouth once. Patient not taking: Reported on 02/23/2017 01/22/15   Lavonia Drafts, MD  fluticasone [facility]) 50 MCG/ACT nasal spray Place 2 sprays into both nostrils daily. 02/23/17   Kirichenko, Lahoma Rocker, PA-C  HYDROcodone-acetaminophen (NORCO) 5-325 MG tablet Take 1-2 tablets by mouth every 6 (six) hours as needed for severe pain. 06/14/16   Margarita Mail, PA-C  HYDROcodone-acetaminophen (NORCO/VICODIN) 5-325 MG tablet Take 1 tablet by mouth every 4 (four) hours as needed. 02/15/17   Tegeler, Gwenyth Allegra, MD  ibuprofen (ADVIL,MOTRIN) 200 MG tablet Take 600 mg by mouth every 6 (six) hours as needed for mild pain.    [provider]  ibuprofen (ADVIL,MOTRIN) 600 MG tablet Take 1 tablet (600 mg total) by mouth every 6 (six) hours as needed. Patient not taking: Reported on 02/23/2017 01/12/15   Lavonia Drafts, MD  metroNIDAZOLE (FLAGYL) 500 MG tablet Take 1 tablet (500 mg total) by mouth 2 (two) times daily. One po bid x 7 days Patient not taking: Reported on 02/23/2017 10/03/16   Domenic Moras, PA-C  ondansetron (ZOFRAN) 4 MG tablet Take 1 tablet (4 mg total) by mouth every 8 (eight) hours  as needed for nausea or vomiting. Patient not taking: Reported on 02/23/2017 10/03/16   Domenic Moras, PA-C    Family History Family History  Problem Relation Age of Onset  . Heart disease Sister   . Hypertension Maternal Grandmother   . Diabetes Maternal Grandmother   . Cancer Maternal Grandmother   . Hypertension Maternal Grandfather   . Diabetes Maternal Grandfather   . Cancer Maternal Grandfather   . Diabetes Paternal Grandmother   . Hypertension  Paternal Grandmother   . Heart disease Paternal Grandfather   . Hypertension Paternal Grandfather   . Diabetes Maternal Uncle   . Anesthesia problems Neg Hx   . Hypotension Neg Hx   . Malignant hyperthermia Neg Hx   . Pseudochol deficiency Neg Hx     Social History Social History  Substance Use Topics  . Smoking status: Never Smoker  . Smokeless tobacco: Never Used  . Alcohol use No     Allergies   Patient has no known allergies.   Review of Systems Review of Systems  Constitutional: Positive for chills and fever.  HENT: Positive for ear pain, sinus pressure and sore throat. Negative for congestion, drooling and sneezing.   Respiratory: Positive for chest tightness. Negative for cough and shortness of breath.   Cardiovascular: Negative for chest pain.  Gastrointestinal: Negative for nausea and vomiting.  Musculoskeletal: Positive for myalgias.  Neurological: Positive for headaches.     Physical Exam Updated Vital Signs BP 127/76 (BP Location: Left Arm)   Pulse (!) 106   Temp 99.4 F (37.4 C) (Oral)   Resp 18   Ht 5\' 4"  (1.626 m)   Wt 74.8 kg (165 lb)   LMP 04/29/2017   SpO2 100%   BMI 28.32 kg/m   Physical Exam  Constitutional: She is oriented to person, place, and time. She appears well-developed and well-nourished. No distress.  HENT:  Head: Normocephalic and atraumatic.  No frontal or maxillary sinus TTP. TMs normal bilaterally without bulging or erythema. Nasal septum is midline with pink mucosa and no nasal congestion. Posterior oropharynx shows tonsillar hypertrophy, exudates, erythema but no uvular deviation. No trismus or sublingual upper maladies.  Eyes: Pupils are equal, round, and reactive to light. Conjunctivae and EOM are normal. Right eye exhibits no discharge. Left eye exhibits no discharge.  Neck: Normal range of motion. Neck supple. No JVD present. No tracheal deviation present.  No midline spine TTP, no paraspinal muscle tenderness, no  deformity, crepitus, or step-off noted. Bilateral anterior cervical lymphadenopathy noted   Cardiovascular: Regular rhythm and normal heart sounds.  Exam reveals no gallop and no friction rub.   No murmur heard. Tachucardic  Pulmonary/Chest: Effort normal and breath sounds normal. No respiratory distress. She has no wheezes. She has no rales.  Abdominal: Soft. Bowel sounds are normal. She exhibits no distension. There is no tenderness.  Musculoskeletal: Normal range of motion. She exhibits no edema or deformity.  Lymphadenopathy:    She has cervical adenopathy.  Neurological: She is alert and oriented to person, place, and time. No cranial nerve deficit or sensory deficit.  Fluent speech, no facial droop, cranial nerves III through XII tested and intact.  Skin: Skin is warm and dry. No erythema.  Psychiatric: She has a normal mood and affect. Her behavior is normal.  Nursing note and vitals reviewed.    ED Treatments / Results  DIAGNOSTIC STUDIES: Oxygen Saturation is 100% on RA, normal by my interpretation.    COORDINATION OF CARE: 11:54 PM  Discussed treatment plan with pt at bedside and pt agreed to plan.  Labs (all labs ordered are listed, but only abnormal results are displayed) Labs Reviewed  RAPID STREP SCREEN (NOT AT [facility])  CULTURE, GROUP A STREP [facility])    EKG  EKG Interpretation None       Radiology No results found.  Procedures Procedures (including critical care time)  Medications Ordered in ED Medications  dexamethasone (DECADRON) tablet 10 mg (not administered)  penicillin g benzathine (BICILLIN LA) 1200000 UNIT/2ML injection 1.2 Million Units (not administered)  acetaminophen (TYLENOL) tablet 650 mg (650 mg Oral Given 05/01/17 2239)     Initial Impression / Assessment and Plan / ED Course  I have reviewed the triage vital signs and the nursing notes.  Pertinent labs & imaging results that were available during my care of the patient were reviewed by  me and considered in my medical decision making (see chart for details).     Pt with tonsillar exudate, cervical lymphadenopathy, & dysphagia; diagnosis of strep. Treated in the Ed with steroids, and PCN IM.  Pt appears mildly dehydrated and tachycardic, discussed importance of water rehydration. Presentation non concerning for PTA or infxn spread to soft tissue. No trismus or uvula deviation. Specific return precautions discussed. Pt able to drink water in ED without difficulty with intact air way. Recommended PCP follow up. Pt verbalized understanding of and agreement with plan and is safe for discharge home at this time.   Final Clinical Impressions(s) / ED Diagnoses   Final diagnoses:  Strep pharyngitis    New Prescriptions New Prescriptions   No medications on file  I personally performed the services described in this documentation, which was scribed in my presence. The recorded information has been reviewed and is accurate.    Renita Papa, PA-C 05/02/17 [phone]

## 2017-05-01 NOTE — ED Triage Notes (Addendum)
Pt c/o sore throat and chills with subjective fever since yesterday, no OTC meds other than cough drops

## 2017-05-02 MED ORDER — PENICILLIN G BENZATHINE 1200000 UNIT/2ML IM SUSP
1.2000 10*6.[IU] | Freq: Once | INTRAMUSCULAR | Status: AC
  Administered 2017-05-02: 1.2 10*6.[IU] via INTRAMUSCULAR
  Filled 2017-05-02: qty 2

## 2017-05-02 MED ORDER — DEXAMETHASONE 6 MG PO TABS
10.0000 mg | ORAL_TABLET | Freq: Once | ORAL | Status: AC
  Administered 2017-05-02: 10 mg via ORAL
  Filled 2017-05-02: qty 1

## 2017-05-02 NOTE — ED Provider Notes (Addendum)
`  Medical screening examination/treatment/procedure(s) were conducted as a shared visit with non-physician practitioner(s) and myself.  I personally evaluated the patient during the encounter.   EKG Interpretation None      Pt is a 26 y.o. female who presents emergency department with several days of sore throat. No productive cough. Low-grade fevers. Exam, patient has bilateral tonsillar hypertrophy with exudate but is slightly worse on the right side. No uvular deviation, muffled voice, trismus or drooling. Her strep test is negative but she does appear to have tonsillitis. I have low suspicion for peritonsillar abscess, deep space neck infection, pneumonia, meningitis at this time. Even though her strep test is negative, I suspect this could be bacterial in nature. Will start her on Antibiotics and give dose of Decadron here for symptomatic relief. We have discussed at length return precautions and signs and symptoms to look out for with. Tonsillar abscess. Patient and family at bedside comfortable with this plan.   Ward, Delice Bison, DO 05/02/17 3568    Ward, Delice Bison, DO 05/02/17 6168

## 2017-05-02 NOTE — Discharge Instructions (Signed)
Take 600 mg ibuprofen every 6 hours as needed for pain. Use warm water salt gargles, warm teas, over-the-counter throat lozenges and sprays for comfort. You have been treated with antibiotics and steroids here. Return to the ED immediately if any concerning signs or symptoms develop. You may follow up with a primary care physician if symptoms persist.

## 2017-05-03 ENCOUNTER — Emergency Department (HOSPITAL_COMMUNITY)
Admission: EM | Admit: 2017-05-03 | Discharge: 2017-05-04 | Disposition: A | Discharge status: Home / Self Care | Payer: Self-pay | Attending: Emergency Medicine | Admitting: Emergency Medicine

## 2017-05-03 ENCOUNTER — Emergency Department (HOSPITAL_COMMUNITY): Payer: Self-pay

## 2017-05-03 ENCOUNTER — Encounter (HOSPITAL_COMMUNITY): Payer: Self-pay | Admitting: Emergency Medicine

## 2017-05-03 DIAGNOSIS — J36 Peritonsillar abscess: Secondary | ICD-10-CM

## 2017-05-03 DIAGNOSIS — R590 Localized enlarged lymph nodes: Secondary | ICD-10-CM | POA: Insufficient documentation

## 2017-05-03 DIAGNOSIS — R062 Wheezing: Secondary | ICD-10-CM | POA: Insufficient documentation

## 2017-05-03 MED ORDER — MORPHINE SULFATE (PF) 4 MG/ML IV SOLN
4.0000 mg | Freq: Once | INTRAVENOUS | Status: AC
  Administered 2017-05-03: 4 mg via INTRAVENOUS
  Filled 2017-05-03: qty 1

## 2017-05-03 MED ORDER — DEXAMETHASONE SODIUM PHOSPHATE 10 MG/ML IJ SOLN
10.0000 mg | Freq: Once | INTRAMUSCULAR | Status: AC
  Administered 2017-05-03: 10 mg via INTRAVENOUS
  Filled 2017-05-03: qty 1

## 2017-05-03 MED ORDER — CLINDAMYCIN PHOSPHATE 600 MG/50ML IV SOLN
600.0000 mg | Freq: Once | INTRAVENOUS | Status: AC
  Administered 2017-05-03: 600 mg via INTRAVENOUS
  Filled 2017-05-03: qty 50

## 2017-05-03 MED ORDER — SODIUM CHLORIDE 0.9 % IV BOLUS (SEPSIS)
1000.0000 mL | Freq: Once | INTRAVENOUS | Status: AC
  Administered 2017-05-03: 1000 mL via INTRAVENOUS

## 2017-05-03 MED ORDER — IPRATROPIUM-ALBUTEROL 0.5-2.5 (3) MG/3ML IN SOLN
3.0000 mL | Freq: Once | RESPIRATORY_TRACT | Status: AC
  Administered 2017-05-03: 3 mL via RESPIRATORY_TRACT
  Filled 2017-05-03: qty 3

## 2017-05-03 NOTE — ED Notes (Signed)
When this EMT was bringing pt back to room, pt was walking from the lobby and pt started to stumble, asked pt if she was okay pt stated "my chest is really tight", pt family stated "her chest has been hurting the whole time we have been here, and I been told them that". I put pt into a chair and asked for someone to go get me a wheel chair to bring pt back to the room. Informed RN of what happened.

## 2017-05-03 NOTE — ED Provider Notes (Signed)
[facility] DEPT Provider Note   CSN: [id number] Arrival date & time: 05/03/17  2005  By signing my name below, I, Reola Mosher, attest that this documentation has been prepared under the direction and in the presence of [employer], PA-C.  Electronically Signed: Reola Mosher, ED Scribe. 05/03/17. 11:14 PM.  History   Chief Complaint Chief Complaint  Patient presents with  . Sore Throat   The history is provided by the patient, a relative and medical records. No language interpreter was used.    HPI Comments: Stacy Cruz is a 26 y.o. female BIB EMS, who presents to the Emergency Department complaining of persistent, worsening sore throat beginning approximately one week ago. Pt's family member notes associated phonation changes, fever, diarrhea, nausea, and vomiting. Her pain is worse with swallowing. Pt was seen in the ED for her symptoms two days ago; at that time she was dx'd w/ strep pharyngitis. She was started on Decadron and given PCN IM; her symptoms have continued to worsenen despite tolerating these medications. Pt also notes associated centralized, intermittent non-radiating chest pain which began last night around 11pm (~12 hours ago). She states that her pain will last for "a while" each time it presents. Her chest pain is worse with coughing. Pt's pain is non-exertional. She notes associated shortness of breath since the onset of her chest pain. Additionally, pt's family member notes that she sustained an episode of near-syncope approximately eight hours ago as well described as her becoming off balance and nearly falling; no full episodes of syncope. No FHx of cardiac issues/events prior to the age of 26yo. Pt is a non-smoker. She denies wheezing, trouble swallowing, drooling, distal numbness/tingling, leg swelling, or any other associated symptoms.   Past Medical History:  Diagnosis Date  . Anemia   . BV (bacterial vaginosis)    H/O  . H/O candidiasis     Patient Active Problem List   Diagnosis Date Noted  . Dermoid cyst of right ovary 01/12/2015  . Gonorrhea 01/12/2015  . Anemia 01/16/2012   Past Surgical History:  Procedure Laterality Date  . LAPAROSCOPIC OVARIAN CYSTECTOMY Left 01/12/2015   Procedure: LAPAROSCOPIC OVARIAN CYSTECTOMY;  Surgeon: Lavonia Drafts, MD;  Location: [facility];  Service: Gynecology;  Laterality: Left;  Dermoid cyst  . WISDOM TOOTH EXTRACTION  2011   OB History    Gravida Para Term Preterm AB Living   2 2 2  0 0 2   SAB TAB Ectopic Multiple Live Births   0 0 0 0 2     Home Medications    Prior to Admission medications   Medication Sig Start Date End Date Taking? Authorizing Provider  amoxicillin (AMOXIL) 500 MG capsule Take 1 capsule (500 mg total) by mouth 3 (three) times daily. 02/23/17   Kirichenko, Lahoma Rocker, PA-C  ciprofloxacin-dexamethasone (CIPRODEX) otic suspension Place 4 drops into the left ear 2 (two) times daily. Patient not taking: Reported on 02/23/2017 06/14/16   Margarita Mail, PA-C  clindamycin (CLEOCIN) 150 MG capsule Take 3 capsules (450 mg total) by mouth 3 (three) times daily. 05/04/17 05/11/17  Belma Dyches, Barth Kirks, PA-C  fluconazole (DIFLUCAN) 150 MG tablet Take 1 tablet (150 mg total) by mouth once. Patient not taking: Reported on 02/23/2017 01/22/15   Lavonia Drafts, MD  fluticasone [facility]) 50 MCG/ACT nasal spray Place 2 sprays into both nostrils daily. 02/23/17   Kirichenko, Lahoma Rocker, PA-C  HYDROcodone-acetaminophen (NORCO) 5-325 MG tablet Take 1-2 tablets by mouth every 6 (six) hours as needed for  severe pain. 06/14/16   Margarita Mail, PA-C  HYDROcodone-acetaminophen (NORCO/VICODIN) 5-325 MG tablet Take 1 tablet by mouth every 4 (four) hours as needed. 02/15/17   Tegeler, Gwenyth Allegra, MD  ibuprofen (ADVIL,MOTRIN) 200 MG tablet Take 600 mg by mouth every 6 (six) hours as needed for mild pain.    [provider]  ibuprofen (ADVIL,MOTRIN) 600 MG tablet Take 1 tablet  (600 mg total) by mouth every 6 (six) hours as needed. Patient not taking: Reported on 02/23/2017 01/12/15   Lavonia Drafts, MD  metroNIDAZOLE (FLAGYL) 500 MG tablet Take 1 tablet (500 mg total) by mouth 2 (two) times daily. One po bid x 7 days Patient not taking: Reported on 02/23/2017 10/03/16   Domenic Moras, PA-C  ondansetron (ZOFRAN) 4 MG tablet Take 1 tablet (4 mg total) by mouth every 8 (eight) hours as needed for nausea or vomiting. Patient not taking: Reported on 02/23/2017 10/03/16   Domenic Moras, PA-C   Family History Family History  Problem Relation Age of Onset  . Heart disease Sister   . Hypertension Maternal Grandmother   . Diabetes Maternal Grandmother   . Cancer Maternal Grandmother   . Hypertension Maternal Grandfather   . Diabetes Maternal Grandfather   . Cancer Maternal Grandfather   . Diabetes Paternal Grandmother   . Hypertension Paternal Grandmother   . Heart disease Paternal Grandfather   . Hypertension Paternal Grandfather   . Diabetes Maternal Uncle   . Anesthesia problems Neg Hx   . Hypotension Neg Hx   . Malignant hyperthermia Neg Hx   . Pseudochol deficiency Neg Hx    Social History Social History  Substance Use Topics  . Smoking status: Never Smoker  . Smokeless tobacco: Never Used  . Alcohol use No   Allergies   Patient has no known allergies.  Review of Systems Review of Systems  Constitutional: Positive for fever.  HENT: Positive for sore throat and voice change. Negative for drooling and trouble swallowing.   Respiratory: Positive for shortness of breath.   Cardiovascular: Positive for chest pain. Negative for leg swelling.  Gastrointestinal: Positive for diarrhea, nausea and vomiting.  Neurological: Positive for syncope (near). Negative for numbness.  All other systems reviewed and are negative.  Physical Exam Updated Vital Signs BP 131/80 (BP Location: Left Arm)   Pulse (!) 101   Temp 99.4 F (37.4 C) (Oral)   Resp 18   LMP  04/29/2017   SpO2 99%   Physical Exam  Constitutional: She appears well-developed and well-nourished. No distress.  Non-toxic appearing.   HENT:  Head: Normocephalic and atraumatic.  Right Ear: External ear normal.  Left Ear: External ear normal.  Nose: Nose normal.  Muffled voice. In control of secretions. Peritonsillar swelling on the right side with deviated uvula. Mild erythema and exudate of right tonsil. No erythema or cobblestoning noted to the PO. No neck crepitus.   Eyes: Conjunctivae are normal.  Neck: Normal range of motion and full passive range of motion without pain. Neck supple. No spinous process tenderness present. Normal range of motion present.  Cardiovascular: Normal rate, regular rhythm, normal heart sounds and intact distal pulses.   Pulmonary/Chest: Effort normal. No respiratory distress. She has wheezes (throughout, expiratory ). She has no rales. She exhibits tenderness (right side).  Abdominal: Soft. She exhibits no distension. There is no tenderness. There is no rebound and no guarding.  Musculoskeletal: Normal range of motion.  Lymphadenopathy:    She has cervical adenopathy (right).  Neurological: She  is alert.  Skin: Skin is warm and dry. No cyanosis. No pallor.  Psychiatric: She has a normal mood and affect. Her behavior is normal.  Nursing note and vitals reviewed.  ED Treatments / Results  DIAGNOSTIC STUDIES: Oxygen Saturation is 100% on RA, normal by my interpretation.   COORDINATION OF CARE: 11:14 PM-Discussed next steps with pt. Pt verbalized understanding and is agreeable with the plan.   Labs (all labs ordered are listed, but only abnormal results are displayed) Labs Reviewed  CBC - Abnormal; Notable for the following:       Result Value   Hemoglobin 11.9 (*)    MCV 77.0 (*)    MCH 24.5 (*)    All other components within normal limits  COMPREHENSIVE METABOLIC PANEL - Abnormal; Notable for the following:    Potassium 2.9 (*)     Glucose, Bld 103 (*)    Creatinine, Ser 1.09 (*)    Calcium 8.5 (*)    ALT 11 (*)    Alkaline Phosphatase 36 (*)    All other components within normal limits   EKG  EKG Interpretation  Date/Time:  Thursday May 03 2017 23:37:41 EDT Ventricular Rate:  90 PR Interval:    QRS Duration: 71 QT Interval:  333 QTC Calculation: 408 R Axis:   37 Text Interpretation:  Sinus rhythm Nonspecific T abnormalities, anterior leads No significant change since last tracing Confirmed by Pryor Curia [id number]) on 05/03/2017 11:40:35 PM      Radiology Dg Chest 2 View  Result Date: 05/03/2017 CLINICAL DATA:  26 year old female with chest tightness. EXAM: CHEST  2 VIEW COMPARISON:  Chest radiograph dated 08/10/2010 FINDINGS: The heart size and mediastinal contours are within normal limits. Both lungs are clear. The visualized skeletal structures are unremarkable. IMPRESSION: No active cardiopulmonary disease. Electronically Signed   By: Anner Crete M.D.   On: 05/03/2017 23:36    Procedures Procedures   Medications Ordered in ED Medications  sodium chloride 0.9 % bolus 1,000 mL (0 mLs Intravenous Stopped 05/04/17 0107)  clindamycin (CLEOCIN) IVPB 600 mg (0 mg Intravenous Stopped 05/04/17 0024)  dexamethasone (DECADRON) injection 10 mg (10 mg Intravenous Given 05/03/17 2345)  ipratropium-albuterol (DUONEB) 0.5-2.5 (3) MG/3ML nebulizer solution 3 mL (3 mLs Nebulization Given 05/03/17 2345)  morphine 4 MG/ML injection 4 mg (4 mg Intravenous Given 05/03/17 2345)  potassium chloride SA (K-DUR,KLOR-CON) CR tablet 40 mEq (40 mEq Oral Given 05/04/17 0108)    Initial Impression / Assessment and Plan / ED Course  I have reviewed the triage vital signs and the nursing notes.  Pertinent labs & imaging results that were available during my care of the patient were reviewed by me and considered in my medical decision making (see chart for details).     26 year old female with peritonsillar abscess. Patient recently  seen in ED for same symptoms diagnosed with strep pharyngitis and given shot of Decadron and IM penicillin. Today she has muffled voice and mild peritonsillar swelling on the right side with deviated uvula. She still in control of her secretions. Patient is also complaining of intermittent chest pain the last 12 hours. There is diffuse wheezing throughout on exam. Basic labs ordered. EKG and chest x-ray ordered to evaluate chest pain and wheezing on exam. Patient given IV clindamycin and Decadron in the emergency department. Pain controlled in the emergency department. CBC without leukocytosis. CMP with potassium of 2.9. Replaced orally. EKG without changes since last tracing. Chest x-ray without any cardiopulmonary changes. After breathing  treatment patient's wheezing improved. No active chest pain currently. Will discharge the patient home on PO clindamycin with follow-up with ENT this week. I advised the patient to return to the emergency department with new or worsening symptoms or new concerns. Specific return precautions discussed. The patient verbalized understanding and agreement with plan. All questions answered. No further questions at this time. At the time of discharge the patient is hemodynamically stable, mentating appropriately, and drove her secretions and in no respiratory distress and appears safe for discharge.   Patient seen and discussed with Dr. Gilford Raid who is in agreement with plan.  Final Clinical Impressions(s) / ED Diagnoses   Final diagnoses:  Peritonsillar abscess   New Prescriptions Discharge Medication List as of 05/04/2017 12:50 AM    START taking these medications   Details  clindamycin (CLEOCIN) 150 MG capsule Take 3 capsules (450 mg total) by mouth 3 (three) times daily., Starting Fri 05/04/2017, Until Fri 05/11/2017, Print       I personally performed the services described in this documentation, which was scribed in my presence. The recorded information has been  reviewed and is accurate.       Jillyn Ledger, PA-C 05/04/17 [id number]    Isla Pence, MD 05/07/17 [phone]

## 2017-05-03 NOTE — ED Triage Notes (Signed)
Pt c/o sore throat x 1 week, pt's friend reports that she lost her voice on Tuesday, pt has pain with swallowing. Seen at [facility], given steroids without relief.

## 2017-05-03 NOTE — ED Notes (Signed)
Pt also states she has tightness in her chest

## 2017-05-04 LAB — COMPREHENSIVE METABOLIC PANEL
ALBUMIN: 3.6 g/dL (ref 3.5–5.0)
ALT: 11 U/L — ABNORMAL LOW (ref 14–54)
ANION GAP: 9 (ref 5–15)
AST: 15 U/L (ref 15–41)
Alkaline Phosphatase: 36 U/L — ABNORMAL LOW (ref 38–126)
BUN: 14 mg/dL (ref 6–20)
CHLORIDE: 103 mmol/L (ref 101–111)
CO2: 25 mmol/L (ref 22–32)
Calcium: 8.5 mg/dL — ABNORMAL LOW (ref 8.9–10.3)
Creatinine, Ser: 1.09 mg/dL — ABNORMAL HIGH (ref 0.44–1.00)
GFR calc Af Amer: >60 mL/min (ref >60)
Glucose, Bld: 103 mg/dL — ABNORMAL HIGH (ref 65–99)
POTASSIUM: 2.9 mmol/L — AB (ref 3.5–5.1)
Sodium: 137 mmol/L (ref 135–145)
Total Bilirubin: 0.4 mg/dL (ref 0.3–1.2)
Total Protein: 7.5 g/dL (ref 6.5–8.1)

## 2017-05-04 LAB — CULTURE, GROUP A STREP (THRC)

## 2017-05-04 LAB — CBC
HCT: 37.4 % (ref 36.0–46.0)
Hemoglobin: 11.9 g/dL — ABNORMAL LOW (ref 12.0–15.0)
MCH: 24.5 pg — ABNORMAL LOW (ref 26.0–34.0)
MCHC: 31.8 g/dL (ref 30.0–36.0)
MCV: 77 fL — AB (ref 78.0–100.0)
PLATELETS: 224 10*3/uL (ref 150–400)
RBC: 4.86 MIL/uL (ref 3.87–5.11)
RDW: 15.3 % (ref 11.5–15.5)
WBC: 6.4 10*3/uL (ref 4.0–10.5)

## 2017-05-04 MED ORDER — POTASSIUM CHLORIDE CRYS ER 20 MEQ PO TBCR
40.0000 meq | EXTENDED_RELEASE_TABLET | Freq: Once | ORAL | Status: AC
  Administered 2017-05-04: 40 meq via ORAL
  Filled 2017-05-04: qty 2

## 2017-05-04 MED ORDER — CLINDAMYCIN HCL 150 MG PO CAPS: 450.0000 mg | ORAL_CAPSULE | Freq: Three times a day (TID) | ORAL | 0 refills | Status: AC

## 2017-05-04 NOTE — Discharge Instructions (Signed)
Please take all of your antibiotics until finished!   You may develop abdominal discomfort or diarrhea from the antibiotic.  You may help offset this with probiotics which you can buy or get in yogurt. Do not eat  or take the probiotics until 2 hours after your antibiotic. Please take 800mg  ibuprofen every 8 hours as needed for pain.   Please call and schedule an appointment with ENT to be seen this week. Your symptoms appear to be the start of an abscess. I have attached a handout on Peritonsillar Abscess. If you develop worsening or new concerning symptoms you can return to the emergency department for re-evaluation.

## 2017-05-04 NOTE — ED Notes (Signed)
Patient verbalized understanding of discharge instructions and denies any further needs or questions at this time. VS stable. Patient ambulatory with steady gait. RN escorted to ED entrance in wheelchair.   

## 2023-11-06 ENCOUNTER — Emergency Department (HOSPITAL_BASED_OUTPATIENT_CLINIC_OR_DEPARTMENT_OTHER)
Admission: EM | Admit: 2023-11-06 | Discharge: 2023-11-06 | Disposition: A | Discharge status: Home / Self Care | Payer: Medicaid Other | Attending: Emergency Medicine | Admitting: Emergency Medicine

## 2023-11-06 ENCOUNTER — Encounter (HOSPITAL_BASED_OUTPATIENT_CLINIC_OR_DEPARTMENT_OTHER): Payer: Self-pay | Admitting: Emergency Medicine

## 2023-11-06 ENCOUNTER — Emergency Department (HOSPITAL_BASED_OUTPATIENT_CLINIC_OR_DEPARTMENT_OTHER): Payer: Medicaid Other

## 2023-11-06 ENCOUNTER — Other Ambulatory Visit: Payer: Self-pay

## 2023-11-06 DIAGNOSIS — R079 Chest pain, unspecified: Secondary | ICD-10-CM | POA: Insufficient documentation

## 2023-11-06 LAB — BASIC METABOLIC PANEL
Anion gap: 6 (ref 5–15)
BUN: 13 mg/dL (ref 6–20)
CO2: 24 mmol/L (ref 22–32)
Calcium: 8.5 mg/dL — ABNORMAL LOW (ref 8.9–10.3)
Chloride: 107 mmol/L (ref 98–111)
Creatinine, Ser: 0.86 mg/dL (ref 0.44–1.00)
GFR, Estimated: >60 mL/min (ref >60)
Glucose, Bld: 98 mg/dL (ref 70–99)
Potassium: 3.8 mmol/L (ref 3.5–5.1)
Sodium: 137 mmol/L (ref 135–145)

## 2023-11-06 LAB — CBC
HCT: 40.3 % (ref 36.0–46.0)
Hemoglobin: 12.8 g/dL (ref 12.0–15.0)
MCH: 26.3 pg (ref 26.0–34.0)
MCHC: 31.8 g/dL (ref 30.0–36.0)
MCV: 82.9 fL (ref 80.0–100.0)
Platelets: 215 10*3/uL (ref 150–400)
RBC: 4.86 MIL/uL (ref 3.87–5.11)
RDW: 14.1 % (ref 11.5–15.5)
WBC: 8 10*3/uL (ref 4.0–10.5)
nRBC: 0 % (ref 0.0–0.2)

## 2023-11-06 LAB — TROPONIN I (HIGH SENSITIVITY)
Troponin I (High Sensitivity): <2 ng/L (ref <18)
Troponin I (High Sensitivity): <2 ng/L (ref <18)

## 2023-11-06 LAB — PREGNANCY, URINE: Preg Test, Ur: NEGATIVE

## 2023-11-06 MED ORDER — FAMOTIDINE 20 MG PO TABS
20.0000 mg | ORAL_TABLET | Freq: Once | ORAL | Status: AC
  Administered 2023-11-06: 20 mg via ORAL
  Filled 2023-11-06: qty 1

## 2023-11-06 MED ORDER — KETOROLAC TROMETHAMINE 15 MG/ML IJ SOLN
15.0000 mg | Freq: Once | INTRAMUSCULAR | Status: AC
  Administered 2023-11-06: 15 mg via INTRAVENOUS
  Filled 2023-11-06: qty 1

## 2023-11-06 NOTE — ED Provider Notes (Signed)
 [facility] Provider Note   CSN: [id number] Arrival date & time: 11/06/23  1800     History Chief Complaint  Patient presents with   Chest Pain    HPI Stacy Cruz is a 33 y.o. female presenting for chest pains over the past 2 weeks.  Denies fevers chills nausea vomiting syncope shortness of breath.  Otherwise ambulatory tolerating p.o. intake.  Had episode 2 hours prior to arrival..   Patient's recorded medical, surgical, social, medication list and allergies were reviewed in the Snapshot window as part of the initial history.   Review of Systems   Review of Systems  Constitutional:  Negative for chills and fever.  HENT:  Negative for ear pain and sore throat.   Eyes:  Negative for pain and visual disturbance.  Respiratory:  Negative for cough and shortness of breath.   Cardiovascular:  Positive for chest pain. Negative for palpitations.  Gastrointestinal:  Negative for abdominal pain and vomiting.  Genitourinary:  Negative for dysuria and hematuria.  Musculoskeletal:  Negative for arthralgias and back pain.  Skin:  Negative for color change and rash.  Neurological:  Negative for seizures and syncope.  All other systems reviewed and are negative.   Physical Exam Updated Vital Signs BP 119/83   Pulse 69   Temp 98.2 F (36.8 C)   Resp 17   Ht 5' 3 (1.6 m)   Wt 77.1 kg   SpO2 100%   BMI 30.11 kg/m  Physical Exam Vitals and nursing note reviewed.  Constitutional:      General: She is not in acute distress.    Appearance: She is well-developed.  HENT:     Head: Normocephalic and atraumatic.  Eyes:     Conjunctiva/sclera: Conjunctivae normal.  Cardiovascular:     Rate and Rhythm: Normal rate and regular rhythm.     Heart sounds: No murmur heard. Pulmonary:     Effort: Pulmonary effort is normal. No respiratory distress.     Breath sounds: Normal breath sounds.  Abdominal:     General: There is no distension.      Palpations: Abdomen is soft.     Tenderness: There is no abdominal tenderness. There is no right CVA tenderness or left CVA tenderness.  Musculoskeletal:        General: No swelling or tenderness. Normal range of motion.     Cervical back: Neck supple.  Skin:    General: Skin is warm and dry.  Neurological:     General: No focal deficit present.     Mental Status: She is alert and oriented to person, place, and time. Mental status is at baseline.     Cranial Nerves: No cranial nerve deficit.      ED Course/ Medical Decision Making/ A&P    Procedures Procedures   Medications Ordered in ED Medications  famotidine  (PEPCID ) tablet 20 mg (20 mg Oral Given 11/06/23 2017)  ketorolac  (TORADOL ) 15 MG/ML injection 15 mg (15 mg Intravenous Given 11/06/23 2022)   Medical Decision Making: Stacy Cruz is a 33 y.o. female who presented to the ED today with chest pain, detailed above.  Based on patient's comorbidities, patient has a heart score of 1.    Additional history discussed with patient's family/caregivers.  Patient placed on continuous vitals and telemetry monitoring while in ED which was reviewed periodically.  Complete initial physical exam performed, notably the patient was HDS in NAD.   Reviewed and confirmed nursing  documentation for past medical history, family history, social history.    Initial Assessment: With the patient's presentation of left-sided chest pain, most likely diagnosis is musculoskeletal chest pain versus GERD, although ACS remains on the differential. Other diagnoses were considered including (but not limited to) pulmonary embolism, community-acquired pneumonia, aortic dissection, pneumothorax, underlying bony abnormality, anemia. These are considered less likely due to history of present illness and physical exam findings.    In particular, concerning pulmonary embolism: Patient is PERC NEGATIVE and the they deny malignancy, recent surgery, history of DVT,  or calf tenderness leading to a LOW risk Wells score. Aortic Dissection also reconsidered but seems less likely based on the location, quality, onset, and severity of symptoms in this case. Patient has a lack of serious comorbidities for this condition including a lack of HTN or Smoking. Patient also has a lack of underlying history of AD or TAA.  This is most consistent with an acute life/limb threatening illness complicated by underlying chronic conditions.   Initial Plan: Evaluate for ACS with delta troponin and EKG evaluated as below  Evaluate for dissection, bony abnormality, or pneumonia with chest x-ray and screening laboratory evaluation including CBC, BMP  Further evaluation for pulmonary embolism not indicated at this time based on patient's PERC and Wells score.  Further evaluation for Thoracic Aortic Dissection not indicated at this time based on patient's clinical history and PE findings.   Initial Study Results: EKG was reviewed independently. Rate, rhythm, axis, intervals all examined and without medically relevant abnormality. ST segments without concerns for elevations.    Laboratory  Delta  troponin demonstratedNAA   CBC and BMP without obvious metabolic or inflammatory abnormalities requiring further evaluation   Radiology  DG Chest 2 View Result Date: 11/06/2023 CLINICAL DATA:  Chest pain EXAM: CHEST - 2 VIEW COMPARISON:  05/03/2017 FINDINGS: Cardiac and mediastinal contours are within normal limits. No focal pulmonary opacity. No pleural effusion or pneumothorax. No acute osseous abnormality. IMPRESSION: No acute cardiopulmonary process. Electronically Signed   By: Donald Campion M.D.   On: 11/06/2023 18:46    Final Assessment and Plan: On repeat clinical assessment, patient has had complete symptomatic resolution after administration of Pepcid  and Toradol .  They are currently ambulatory tolerating p.o. intake.  They deny fevers or chills, nausea vomiting, syncope shortness  of breath.  Favor likely musculoskeletal versus gastroesophageal etiology of patient's symptoms.  Considered ACS grossly less likely given resolution, well appearance and negative troponin and low HEART score.  Patient to follow-up with primary care provider within 48 hours for ongoing care and management.       Clinical Impression:  1. Chest pain, unspecified type      Discharge   Final Clinical Impression(s) / ED Diagnoses Final diagnoses:  Chest pain, unspecified type    Rx / DC Orders ED Discharge Orders     None         Jerral Meth, MD 11/06/23 2134

## 2023-11-06 NOTE — ED Triage Notes (Signed)
States started to have cp and sweats comes and goes  numbness in  left arm that comes and goes has never has this before

## 2024-10-07 ENCOUNTER — Emergency Department (HOSPITAL_BASED_OUTPATIENT_CLINIC_OR_DEPARTMENT_OTHER)

## 2024-10-07 ENCOUNTER — Other Ambulatory Visit: Payer: Self-pay

## 2024-10-07 ENCOUNTER — Encounter (HOSPITAL_BASED_OUTPATIENT_CLINIC_OR_DEPARTMENT_OTHER): Payer: Self-pay | Admitting: Emergency Medicine

## 2024-10-07 ENCOUNTER — Emergency Department (HOSPITAL_BASED_OUTPATIENT_CLINIC_OR_DEPARTMENT_OTHER)
Admission: EM | Admit: 2024-10-07 | Discharge: 2024-10-08 | Disposition: A | Discharge status: Home / Self Care | Attending: Emergency Medicine | Admitting: Emergency Medicine

## 2024-10-07 DIAGNOSIS — J069 Acute upper respiratory infection, unspecified: Secondary | ICD-10-CM | POA: Diagnosis not present

## 2024-10-07 DIAGNOSIS — R059 Cough, unspecified: Secondary | ICD-10-CM | POA: Diagnosis present

## 2024-10-07 LAB — CBC
HCT: 40 % (ref 36.0–46.0)
Hemoglobin: 12.7 g/dL (ref 12.0–15.0)
MCH: 25.7 pg — ABNORMAL LOW (ref 26.0–34.0)
MCHC: 31.8 g/dL (ref 30.0–36.0)
MCV: 81 fL (ref 80.0–100.0)
Platelets: 247 K/uL (ref 150–400)
RBC: 4.94 MIL/uL (ref 3.87–5.11)
RDW: 15 % (ref 11.5–15.5)
WBC: 6.5 K/uL (ref 4.0–10.5)
nRBC: 0 % (ref 0.0–0.2)

## 2024-10-07 LAB — TROPONIN T, HIGH SENSITIVITY
Troponin T High Sensitivity: <15 ng/L (ref 0–19)
Troponin T High Sensitivity: <15 ng/L (ref 0–19)

## 2024-10-07 LAB — BASIC METABOLIC PANEL WITH GFR
Anion gap: 13 (ref 5–15)
BUN: 8 mg/dL (ref 6–20)
CO2: 24 mmol/L (ref 22–32)
Calcium: 9.1 mg/dL (ref 8.9–10.3)
Chloride: 104 mmol/L (ref 98–111)
Creatinine, Ser: 0.79 mg/dL (ref 0.44–1.00)
GFR, Estimated: >60 mL/min
Glucose, Bld: 94 mg/dL (ref 70–99)
Potassium: 3.7 mmol/L (ref 3.5–5.1)
Sodium: 140 mmol/L (ref 135–145)

## 2024-10-07 MED ORDER — IPRATROPIUM-ALBUTEROL 0.5-2.5 (3) MG/3ML IN SOLN
3.0000 mL | Freq: Once | RESPIRATORY_TRACT | Status: AC
  Administered 2024-10-08: 3 mL via RESPIRATORY_TRACT
  Filled 2024-10-07: qty 3

## 2024-10-07 MED ORDER — PREDNISONE 50 MG PO TABS
60.0000 mg | ORAL_TABLET | Freq: Once | ORAL | Status: AC
  Administered 2024-10-08: 60 mg via ORAL
  Filled 2024-10-07: qty 1

## 2024-10-07 NOTE — Progress Notes (Signed)
 " Subjective Patient ID: Stacy Cruz is a 34 y.o. female.  Chief Complaint  Patient presents with   URI    Cough, congestion, faint feeling, chills, HA, x6days. Vomiting x3days. Mucinex, tylenol , ibuprofen  for tx. Some fluid intake. No direct exposure to flu or covid. Pain scale: 10/10 for HA, pulsating.    The following information was reviewed by members of the visit team:  Allergies  Meds  Problems  Med Hx  Surg Hx  OB Status  Fam Hx      34 yo female with several episodes of vomiting and diarrhea in last 3 days reports fainting earlier today because she was weak. Denies hitting head. She has been sick with cough, congestion and chills for 6 days but unsure of exposures. She notes increased headache today to 10/10 throbbing on right side and is the worse headache of life. Denies migraine hx. No OTC relief. She was driven to urgent care and dropped off by family member  URI  Associated symptoms include congestion, coughing, diarrhea, nausea and vomiting. Pertinent negatives include no rash.   Medications Ordered Prior to Encounter[1] Patient has no known allergies.  Review of Systems  Constitutional:  Positive for fatigue.  HENT:  Positive for congestion.   Respiratory:  Positive for cough.   Gastrointestinal:  Positive for diarrhea, nausea and vomiting.  Musculoskeletal:  Negative for back pain.  Skin:  Negative for rash.  Neurological:  Positive for syncope.   BP 124/84   Pulse 75   Temp 98.5 F (36.9 C) (Temporal)   Resp 16   Ht 1.626 m (5' 4)   Wt 79.2 kg (174 lb 9.6 oz)   LMP 09/17/2024 (Approximate)   SpO2 99%   BMI 29.97 kg/m   Objective Physical Exam Vitals and nursing note reviewed.  Constitutional:      Appearance: Normal appearance.     Comments: Appears sick and uncomfortable  HENT:     Head: Normocephalic and atraumatic.     Right Ear: Tympanic membrane and external ear normal.     Left Ear: Tympanic membrane and external ear normal.      Nose: Nose normal. No congestion or rhinorrhea.     Mouth/Throat:     Pharynx: No oropharyngeal exudate or posterior oropharyngeal erythema.  Eyes:     General: No scleral icterus.       Right eye: No discharge.        Left eye: No discharge.     Conjunctiva/sclera: Conjunctivae normal.  Cardiovascular:     Rate and Rhythm: Normal rate and regular rhythm.     Pulses: Normal pulses.     Heart sounds: Normal heart sounds.  Pulmonary:     Effort: Pulmonary effort is normal. No respiratory distress.     Breath sounds: Normal breath sounds. No wheezing, rhonchi or rales.  Abdominal:     General: Bowel sounds are normal. There is no distension.     Palpations: Abdomen is soft.     Tenderness: There is no abdominal tenderness. There is no right CVA tenderness, left CVA tenderness or guarding.  Musculoskeletal:     Cervical back: Normal range of motion and neck supple.  Skin:    General: Skin is warm.     Capillary Refill: Capillary refill takes less than 2 seconds.  Neurological:     General: No focal deficit present.     Mental Status: She is alert and oriented to person, place, and time.  Motor: No weakness.     Gait: Gait normal.  Psychiatric:        Thought Content: Thought content normal.        Judgment: Judgment normal.     Assessment/Plan 1. Acute intractable headache, unspecified headache type      2. Diarrhea, unspecified type       -advised ER for further evaluation with concern for dehydration and worse headache of life - patient will be transported by EMS. Call placed at [phone] and was advised by operator not an emergent call and EMS would arrive when available despite my concern for weakness and possible brain hemorrhage -Patient understands need for immediate evaluation    Electronically signed: Melissa Raylene Smith, PA-C 10/07/2024  4:43 PM        [1] No current outpatient medications on file prior to visit.   No current facility-administered  medications on file prior to visit.  "

## 2024-10-07 NOTE — ED Provider Notes (Signed)
 " [facility] Provider Note   CSN: [id number] Arrival date & time: 10/07/24  1902     Patient presents with: Chest Pain and URI   Stacy Cruz is a 34 y.o. female.   The history is provided by the patient.  Chest Pain URI  She has been sick for the last 6 days with fever as high as 105.2, chills, sweats, cough productive of sputum which is clear to pale yellow.  She also endorses generalized bodyaches.  Today she had an episode where her arm went numb and she passed out.  She is complaining of some aching in her chest.  She went to urgent care and was advised to come to the emergency department.  She denies any sick contacts.  She did not get the influenza immunization this year.    Prior to Admission medications  Medication Sig Start Date End Date Taking? Authorizing Provider  amoxicillin  (AMOXIL ) 500 MG capsule Take 1 capsule (500 mg total) by mouth 3 (three) times daily. 02/23/17   Kirichenko, Tatyana, PA-C  ciprofloxacin -dexamethasone  (CIPRODEX ) otic suspension Place 4 drops into the left ear 2 (two) times daily. Patient not taking: Reported on 02/23/2017 06/14/16   Harris, Abigail, PA-C  fluconazole  (DIFLUCAN ) 150 MG tablet Take 1 tablet (150 mg total) by mouth once. Patient not taking: Reported on 02/23/2017 01/22/15   Corene Coy, MD  fluticasone  (FLONASE ) 50 MCG/ACT nasal spray Place 2 sprays into both nostrils daily. 02/23/17   Kirichenko, Tatyana, PA-C  HYDROcodone -acetaminophen  (NORCO) 5-325 MG tablet Take 1-2 tablets by mouth every 6 (six) hours as needed for severe pain. 06/14/16   Harris, Abigail, PA-C  HYDROcodone -acetaminophen  (NORCO/VICODIN) 5-325 MG tablet Take 1 tablet by mouth every 4 (four) hours as needed. 02/15/17   Tegeler, Lonni PARAS, MD  ibuprofen  (ADVIL ,MOTRIN ) 200 MG tablet Take 600 mg by mouth every 6 (six) hours as needed for mild pain.    [provider]  ibuprofen  (ADVIL ,MOTRIN ) 600 MG  tablet Take 1 tablet (600 mg total) by mouth every 6 (six) hours as needed. Patient not taking: Reported on 02/23/2017 01/12/15   Corene Coy, MD  metroNIDAZOLE  (FLAGYL ) 500 MG tablet Take 1 tablet (500 mg total) by mouth 2 (two) times daily. One po bid x 7 days Patient not taking: Reported on 02/23/2017 10/03/16   Nivia Colon, PA-C  ondansetron  (ZOFRAN ) 4 MG tablet Take 1 tablet (4 mg total) by mouth every 8 (eight) hours as needed for nausea or vomiting. Patient not taking: Reported on 02/23/2017 10/03/16   Nivia Colon, PA-C    Allergies: Patient has no known allergies.    Review of Systems  Cardiovascular:  Positive for chest pain.  All other systems reviewed and are negative.   Updated Vital Signs BP (!) 137/91   Pulse 64   Temp 98.3 F (36.8 C)   Resp 17   Ht 5' 3 (1.6 m)   Wt 77.1 kg   LMP 09/23/2024 (Approximate)   SpO2 100%   BMI 30.11 kg/m   Physical Exam Vitals and nursing note reviewed.   34 year old female, resting comfortably and in no acute distress. Vital signs are significant for borderline elevated blood pressure. Oxygen saturation is 100%, which is normal. Head is normocephalic and atraumatic. PERRLA, EOMI. Oropharynx is clear. Neck is nontender and supple without adenopathy. Lungs are clear without rales, wheezes, or rhonchi.  However, with forced exhalation, she has moderate to severe wheezing. Chest is nontender. Heart  has regular rate and rhythm without murmur. Abdomen is soft, flat, nontender. Skin is warm and dry without rash. Neurologic: Mental status is normal, cranial nerves are intact, moves all extremities equally.  (all labs ordered are listed, but only abnormal results are displayed) Labs Reviewed  CBC - Abnormal; Notable for the following components:      Result Value   MCH 25.7 (*)    All other components within normal limits  BASIC METABOLIC PANEL WITH GFR  PREGNANCY, URINE  TROPONIN T, HIGH SENSITIVITY  TROPONIN T, HIGH  SENSITIVITY    EKG: ED ECG REPORT   Date: 10/08/2024  Rate: 67  Rhythm: normal sinus rhythm  QRS Axis: normal  Intervals: normal  ST/T Wave abnormalities: normal  Conduction Disutrbances:none  Narrative Interpretation: Normal ECG.  When compared with ECG of 11/06/2023, no significant changes are seen.  Old EKG Reviewed: unchanged  I have personally reviewed the EKG tracing and agree with the computerized printout as noted.  Radiology: DG Chest 2 View Result Date: 10/07/2024 CLINICAL DATA:  Cough with congestion and malaise x1 week. Chest pain x3 days with tingling in the left arm. EXAM: DG CHEST 2V COMPARISON:  None Available. FINDINGS: The heart size and mediastinal contours are within normal limits. Both lungs are clear. The visualized skeletal structures are unremarkable. IMPRESSION: No active cardiopulmonary disease. Electronically Signed   By: Suzen Dials M.D.   On: 10/07/2024 19:36     Procedures   Medications Ordered in the ED  HYDROcodone -acetaminophen  (NORCO/VICODIN) 5-325 MG per tablet 1 tablet (has no administration in time range)  ipratropium-albuterol  (DUONEB) 0.5-2.5 (3) MG/3ML nebulizer solution 3 mL (3 mLs Nebulization Given 10/08/24 0018)  predniSONE  (DELTASONE ) tablet 60 mg (60 mg Oral Given 10/08/24 0005)  ipratropium-albuterol  (DUONEB) 0.5-2.5 (3) MG/3ML nebulizer solution 3 mL (3 mLs Nebulization Given 10/08/24 0042)                                    Medical Decision Making Amount and/or Complexity of Data Reviewed Labs: ordered. Radiology: ordered.  Risk Prescription drug management.   Influenza-like illness.  Consider influenza, RSV, COVID-19 versus pneumonia.  Episode of her arm going numb sounds like it was likely secondary to hyperventilation-no evidence of stroke on exam.  I have reviewed her electrocardiogram, my interpretation is normal ECG and unchanged from prior.  Chest x-ray shows no evidence of pneumonia.  I have independently viewed the  images, and agree with the radiologist's interpretation.  I have reviewed her laboratory tests, my interpretation is normal CBC, normal basic metabolic panel, normal troponin x 2.  Patient is outside of the window where antiviral treatment would be effective for influenza and COVID-19, so no indication for viral testing.  I have ordered albuterol  with ipratropium via nebulizer to try to help with her coughing.  Cough actually got worse following albuterol  with ipratropium.  I have ordered a dose of hydrocodone -acetaminophen  for cough suppression.  She feels much better following above-noted treatment, cough is much improved.  I am discharging her with a prescription for hydrocodone -acetaminophen  as well as instructions to the maintain hydration and use over-the-counter NSAIDs and acetaminophen .     Final diagnoses:  Viral URI with cough    ED Discharge Orders          Ordered    HYDROcodone -acetaminophen  (NORCO/VICODIN) 5-325 MG tablet  Every 4 hours PRN        10/08/24  9745               Raford Lenis, MD 10/08/24 [phone]  "

## 2024-10-07 NOTE — ED Triage Notes (Signed)
 Pt c/o cough, congestion, malaise x 1 week. Reports chest pain x 3 days, tingling in L arm today. Pain worse with coughing.

## 2024-10-07 NOTE — ED Notes (Signed)
 Patient aware of necessity of urine sample.

## 2024-10-07 NOTE — ED Notes (Signed)
 Patient transferred from waiting room to ED treatment room. Assuming pt care at this time.

## 2024-10-07 NOTE — ED Triage Notes (Signed)
 Pt coming from UC for headache, N/V/D x1 week. UC staff told [facility] she was dropped off and they did not feel safe letting her leave alone so EMS as called. They did no tx there.

## 2024-10-08 LAB — PREGNANCY, URINE: Preg Test, Ur: NEGATIVE

## 2024-10-08 MED ORDER — HYDROCODONE-ACETAMINOPHEN 5-325 MG PO TABS
1.0000 | ORAL_TABLET | Freq: Once | ORAL | Status: AC
  Administered 2024-10-08: 1 via ORAL
  Filled 2024-10-08: qty 1

## 2024-10-08 MED ORDER — HYDROCODONE-ACETAMINOPHEN 5-325 MG PO TABS: 1.0000 | ORAL_TABLET | ORAL | 0 refills | Status: AC | PRN

## 2024-10-08 MED ORDER — IPRATROPIUM-ALBUTEROL 0.5-2.5 (3) MG/3ML IN SOLN
3.0000 mL | Freq: Once | RESPIRATORY_TRACT | Status: AC
  Administered 2024-10-08: 3 mL via RESPIRATORY_TRACT
  Filled 2024-10-08: qty 3

## 2024-10-08 NOTE — Discharge Instructions (Addendum)
 Drink plenty of fluids.  Take acetaminophen  and/or ibuprofen  as needed for fever or pain.  Please be aware that if you combine acetaminophen  and ibuprofen , you get better pain relief and better fever control and you get from taking either medication by itself.  You may use over-the-counter cough and cold medications.  Reserve the hydrocodone -acetaminophen  for severe cough.  If you take hydrocodone , it makes you drowsy and not a safe driver.  It is best used at night to help you sleep.

## 2024-10-08 NOTE — ED Notes (Signed)
